# Patient Record
Sex: Female | Born: 1960 | Race: White | Hispanic: No | Smoking: Former smoker
Health system: Southern US, Community
[De-identification: ages and names within clinical notes are randomized; demographics above are authoritative.]

## PROBLEM LIST (undated history)

## (undated) DIAGNOSIS — F329 Major depressive disorder, single episode, unspecified: Secondary | ICD-10-CM

## (undated) DIAGNOSIS — G8929 Other chronic pain: Secondary | ICD-10-CM

## (undated) DIAGNOSIS — M549 Dorsalgia, unspecified: Secondary | ICD-10-CM

## (undated) DIAGNOSIS — F32A Depression, unspecified: Secondary | ICD-10-CM

## (undated) DIAGNOSIS — K219 Gastro-esophageal reflux disease without esophagitis: Secondary | ICD-10-CM

## (undated) HISTORY — PX: CERVICAL FUSION: SHX112

## (undated) HISTORY — DX: Gastro-esophageal reflux disease without esophagitis: K21.9

## (undated) HISTORY — DX: Other chronic pain: G89.29

---

## 2003-11-14 ENCOUNTER — Inpatient Hospital Stay (HOSPITAL_COMMUNITY): Admission: RE | Admit: 2003-11-14 | Discharge: 2003-11-19 | Payer: Self-pay | Admitting: Neurological Surgery

## 2004-04-13 ENCOUNTER — Ambulatory Visit (HOSPITAL_COMMUNITY): Admission: RE | Admit: 2004-04-13 | Discharge: 2004-04-13 | Payer: Self-pay | Admitting: Neurological Surgery

## 2006-11-28 ENCOUNTER — Ambulatory Visit (HOSPITAL_COMMUNITY): Payer: Self-pay | Admitting: Psychiatry

## 2006-12-02 ENCOUNTER — Ambulatory Visit (HOSPITAL_COMMUNITY): Payer: Self-pay | Admitting: Psychiatry

## 2007-01-08 ENCOUNTER — Ambulatory Visit (HOSPITAL_COMMUNITY): Payer: Self-pay | Admitting: Psychiatry

## 2007-03-13 ENCOUNTER — Ambulatory Visit (HOSPITAL_COMMUNITY): Payer: Self-pay | Admitting: Psychiatry

## 2007-03-18 ENCOUNTER — Ambulatory Visit (HOSPITAL_COMMUNITY): Payer: Self-pay | Admitting: Psychology

## 2007-05-11 ENCOUNTER — Ambulatory Visit (HOSPITAL_COMMUNITY): Payer: Self-pay | Admitting: Psychiatry

## 2007-10-12 ENCOUNTER — Ambulatory Visit (HOSPITAL_COMMUNITY): Payer: Self-pay | Admitting: Psychiatry

## 2007-11-09 ENCOUNTER — Ambulatory Visit (HOSPITAL_COMMUNITY): Payer: Self-pay | Admitting: Psychiatry

## 2008-01-12 ENCOUNTER — Ambulatory Visit (HOSPITAL_COMMUNITY): Payer: Self-pay | Admitting: Psychiatry

## 2008-03-08 ENCOUNTER — Ambulatory Visit (HOSPITAL_COMMUNITY): Payer: Self-pay | Admitting: Psychology

## 2008-03-17 ENCOUNTER — Ambulatory Visit (HOSPITAL_COMMUNITY): Payer: Self-pay | Admitting: Psychology

## 2010-06-08 NOTE — Op Note (Signed)
Mackenzie Charles, Mackenzie Charles         ACCOUNT NO.:  [id number]   MEDICAL RECORD NO.:  [id number]          PATIENT TYPE:  INP   LOCATION:  3011                         FACILITY:  [facility]   PHYSICIAN:  Stefani Dama, M.D.  DATE OF BIRTH:  10-Mar-1960   DATE OF PROCEDURE:  11/14/2003  DATE OF DISCHARGE:                                 OPERATIVE REPORT   PREOPERATIVE DIAGNOSIS:  L4-L5 spondylolisthesis with herniated nucleus  pulposus and right lumbar radiculopathy.   POSTOPERATIVE DIAGNOSIS:  L4-L5 spondylolisthesis with herniated nucleus  pulposus and right lumbar radiculopathy.   OPERATION:  Bilateral laminotomies, foraminotomies, discectomy L4-L5, poster  lumbar interbody arthrodesis with pedicle screw fixation and Alphatec  __________ cages, arthrodesis with local autograft and allograft.   SURGEON:  Stefani Dama, M.D.   FIRST ASSISTANT:  Mackenzie Charles. Mackenzie Charles, M.D.   ANESTHESIA:  General endotracheal anesthesia.   INDICATIONS FOR PROCEDURE:  Mackenzie Charles is a 50 year old individual who  has had significant back and right lower extremity pain.  She has evidence  of a herniated nucleus pulposus at the L4-L5 level in addition to severe  degenerative spondylolisthesis.  She had been advised regarding surgical  decompression of same.   DESCRIPTION OF PROCEDURE:  The patient was brought to the operating room  supine on a stretcher after smooth induction of general endotracheal  anesthesia.  She was turned prone, the back was prepped with DuraPrep and  draped in a sterile fashion.  A midline incision was created and carried  down to the lumbodorsal fascia which was opened on either side of the  midline.  Fluoroscopy was used to identify the laminar arch of L4.  The  spaces were then cleared out in subperiosteal fashion dissecting the laminar  arches out to the facet joints at L4-L5.  Self-retaining McCullough  retractor was placed in the wound.  The dissection was then carried to  expose the interlaminar space at L4-L5 and laminotomies were then created  removing the inferior margin of lamina of L4 out to and including the facet  joint at L4-L5.  The common dural tube was then decompressed and the takeoff  of the L5 nerve root was identified.  There was noted to be a large  subligamentous disc herniation on the left side at the L4-L5 space.  This  was cleared using 15 blade to open the posterior longitudinal ligament then  evacuating a substantial quantity of degenerative disc material.  The disc  space was then entered and a complete discectomy was performed both medially  and laterally, being careful to expose the L4 nerve root superiorly and  protect the nerve root while discectomy was completed.  On the right side  then similar procedure was carried out and a complete discectomy was  similarly carried out.  The end plates were then rongeured carefully and a  raspatory was used to smooth the end plates and allow foot placement of  interbody graft.  A 10 mm size interbody graft was chosen and this was  placed without difficulty and countersunk a few millimeters.  [name]  own bone was mixed with 10 mL alloquat  of a tricalcium phosphate bone  matrix.  This was mixed with an aspirate from the pedicle obtained from L4  and L5.  This was tamped into position and countersunk and once the  interbody grafting procedure was completed, pedicle entry sites were chosen  at L4 and L5 and these were checked fluoroscopically.  Then 6.5 x 40 mm  screws were tapped and placed into the pedicles at L4 and L5.  These were  connected with short precontoured 35 mm rods.  The system was tightened down  and a slight bit of compression.  The final localizing radiograph of both  the AP and lateral projection identified a very natural curve to the lumbar  spine.  The wound was copiously irrigated with antibiotic irrigating  solution and finally the inner laminar space was filled with a  pledge of  Healos bone matrix substitute to augment posterior fusion.  Once this was  completed then retractor was removed. Lumbodorsal fascia was closed with #1  Vicryl interrupted fashion, 2-0 Vicryl was used subcutaneously, 3-0 Vicryl  subcuticularly.  Dermabond was placed on the skin.  Blood loss for the  procedure was estimated about 400 mL.       HJE/MEDQ  D:  11/14/2003  T:  11/14/2003  Job:  [id number]
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
## 2014-10-06 ENCOUNTER — Inpatient Hospital Stay (HOSPITAL_BASED_OUTPATIENT_CLINIC_OR_DEPARTMENT_OTHER)
Admission: EM | Admit: 2014-10-06 | Discharge: 2014-10-12 | DRG: 029 | Disposition: A | Discharge status: Skilled Nursing Facility | Payer: Medicare Other | Attending: Neurosurgery | Admitting: Neurosurgery

## 2014-10-06 ENCOUNTER — Emergency Department (HOSPITAL_COMMUNITY): Payer: Medicare Other

## 2014-10-06 ENCOUNTER — Encounter (HOSPITAL_BASED_OUTPATIENT_CLINIC_OR_DEPARTMENT_OTHER): Payer: Self-pay | Admitting: Emergency Medicine

## 2014-10-06 DIAGNOSIS — M4646 Discitis, unspecified, lumbar region: Secondary | ICD-10-CM | POA: Diagnosis present

## 2014-10-06 DIAGNOSIS — M5440 Lumbago with sciatica, unspecified side: Secondary | ICD-10-CM | POA: Diagnosis not present

## 2014-10-06 DIAGNOSIS — K5900 Constipation, unspecified: Secondary | ICD-10-CM | POA: Diagnosis present

## 2014-10-06 DIAGNOSIS — B954 Other streptococcus as the cause of diseases classified elsewhere: Secondary | ICD-10-CM | POA: Diagnosis present

## 2014-10-06 DIAGNOSIS — B9620 Unspecified Escherichia coli [E. coli] as the cause of diseases classified elsewhere: Secondary | ICD-10-CM | POA: Diagnosis present

## 2014-10-06 DIAGNOSIS — K219 Gastro-esophageal reflux disease without esophagitis: Secondary | ICD-10-CM | POA: Diagnosis present

## 2014-10-06 DIAGNOSIS — G8220 Paraplegia, unspecified: Secondary | ICD-10-CM | POA: Diagnosis present

## 2014-10-06 DIAGNOSIS — R531 Weakness: Secondary | ICD-10-CM

## 2014-10-06 DIAGNOSIS — Z419 Encounter for procedure for purposes other than remedying health state, unspecified: Secondary | ICD-10-CM

## 2014-10-06 DIAGNOSIS — E669 Obesity, unspecified: Secondary | ICD-10-CM | POA: Diagnosis present

## 2014-10-06 DIAGNOSIS — F17210 Nicotine dependence, cigarettes, uncomplicated: Secondary | ICD-10-CM | POA: Diagnosis present

## 2014-10-06 DIAGNOSIS — M4626 Osteomyelitis of vertebra, lumbar region: Secondary | ICD-10-CM | POA: Diagnosis present

## 2014-10-06 DIAGNOSIS — M4806 Spinal stenosis, lumbar region: Secondary | ICD-10-CM | POA: Diagnosis present

## 2014-10-06 DIAGNOSIS — M609 Myositis, unspecified: Secondary | ICD-10-CM | POA: Diagnosis present

## 2014-10-06 DIAGNOSIS — G061 Intraspinal abscess and granuloma: Secondary | ICD-10-CM | POA: Diagnosis not present

## 2014-10-06 DIAGNOSIS — Z6841 Body Mass Index (BMI) 40.0 and over, adult: Secondary | ICD-10-CM

## 2014-10-06 DIAGNOSIS — F329 Major depressive disorder, single episode, unspecified: Secondary | ICD-10-CM | POA: Diagnosis present

## 2014-10-06 DIAGNOSIS — Z981 Arthrodesis status: Secondary | ICD-10-CM

## 2014-10-06 DIAGNOSIS — Z79891 Long term (current) use of opiate analgesic: Secondary | ICD-10-CM

## 2014-10-06 DIAGNOSIS — M549 Dorsalgia, unspecified: Secondary | ICD-10-CM

## 2014-10-06 DIAGNOSIS — N390 Urinary tract infection, site not specified: Secondary | ICD-10-CM | POA: Diagnosis present

## 2014-10-06 HISTORY — DX: Dorsalgia, unspecified: M54.9

## 2014-10-06 HISTORY — DX: Major depressive disorder, single episode, unspecified: F32.9

## 2014-10-06 HISTORY — DX: Depression, unspecified: F32.A

## 2014-10-06 HISTORY — DX: Other chronic pain: G89.29

## 2014-10-06 LAB — BASIC METABOLIC PANEL
Anion gap: 10 (ref 5–15)
BUN: 9 mg/dL (ref 6–20)
CO2: 25 mmol/L (ref 22–32)
Calcium: 8.7 mg/dL — ABNORMAL LOW (ref 8.9–10.3)
Chloride: 101 mmol/L (ref 101–111)
Creatinine, Ser: 0.52 mg/dL (ref 0.44–1.00)
GFR calc Af Amer: >60 mL/min (ref >60)
GFR calc non Af Amer: >60 mL/min (ref >60)
Glucose, Bld: 109 mg/dL — ABNORMAL HIGH (ref 65–99)
Potassium: 2.8 mmol/L — ABNORMAL LOW (ref 3.5–5.1)
Sodium: 136 mmol/L (ref 135–145)

## 2014-10-06 LAB — CBC WITH DIFFERENTIAL/PLATELET
Basophils Absolute: 0 10*3/uL (ref 0.0–0.1)
Basophils Relative: 0 %
Eosinophils Absolute: 0 10*3/uL (ref 0.0–0.7)
Eosinophils Relative: 0 %
HCT: 34.2 % — ABNORMAL LOW (ref 36.0–46.0)
Hemoglobin: 11.2 g/dL — ABNORMAL LOW (ref 12.0–15.0)
Lymphocytes Relative: 12 %
Lymphs Abs: 1.4 10*3/uL (ref 0.7–4.0)
MCH: 24.7 pg — ABNORMAL LOW (ref 26.0–34.0)
MCHC: 32.7 g/dL (ref 30.0–36.0)
MCV: 75.3 fL — ABNORMAL LOW (ref 78.0–100.0)
Monocytes Absolute: 0.7 10*3/uL (ref 0.1–1.0)
Monocytes Relative: 6 %
Neutro Abs: 9.3 10*3/uL — ABNORMAL HIGH (ref 1.7–7.7)
Neutrophils Relative %: 82 %
Platelets: 178 10*3/uL (ref 150–400)
RBC: 4.54 MIL/uL (ref 3.87–5.11)
RDW: 15.3 % (ref 11.5–15.5)
WBC: 11.4 10*3/uL — ABNORMAL HIGH (ref 4.0–10.5)

## 2014-10-06 LAB — URINE MICROSCOPIC-ADD ON

## 2014-10-06 LAB — URINALYSIS, ROUTINE W REFLEX MICROSCOPIC
Bilirubin Urine: NEGATIVE
Glucose, UA: NEGATIVE mg/dL
Ketones, ur: NEGATIVE mg/dL
Nitrite: NEGATIVE
Protein, ur: NEGATIVE mg/dL
Specific Gravity, Urine: 1.015 (ref 1.005–1.030)
Urobilinogen, UA: 1 mg/dL (ref 0.0–1.0)
pH: 6 (ref 5.0–8.0)

## 2014-10-06 MED ORDER — HYDROMORPHONE HCL 1 MG/ML IJ SOLN
1.0000 mg | Freq: Once | INTRAMUSCULAR | Status: AC
  Administered 2014-10-06: 1 mg via INTRAVENOUS
  Filled 2014-10-06: qty 1

## 2014-10-06 MED ORDER — LORAZEPAM 2 MG/ML IJ SOLN
1.0000 mg | Freq: Once | INTRAMUSCULAR | Status: AC
  Administered 2014-10-06: 1 mg via INTRAVENOUS
  Filled 2014-10-06: qty 1

## 2014-10-06 NOTE — ED Provider Notes (Signed)
CSN: [id number]     Arrival date & time 10/06/14  2159 History  This chart was scribed for Zadie Rhine, MD by Gwenyth Ober, ED Scribe. This patient was seen in room MH04/MH04 and the patient's care was started at 10:12 PM.    Chief Complaint  Patient presents with  . Back Pain   The history is provided by the patient. No language interpreter was used.   HPI Comments: Mackenzie Charles is a 54 y.o. female brought in by EMS, with a history of chronic back pain and multiple spinal fusions, who presents to the Emergency Department complaining of chronic, moderate lower back pain and progressively worsening weakness of her bilateral lower extremities within the last 24 hours. She states numbness of her lower legs as an associated symptom. Pt reports that she was seen by [facility] 2 days ago for worsening leg pain, but denies weakness at that time. She has been unable to use her legs today. Pt states that she was able to walk 2-3 days ago. She denies bladder or bowel incontinence, CP, abdominal pain and vomiting as associated symptoms.  Past Medical History  Diagnosis Date  . Depression   . Chronic back pain    Past Surgical History  Procedure Laterality Date  . Cervical fusion     History reviewed. No pertinent family history. Social History  Substance Use Topics  . Smoking status: Not on file  . Smokeless tobacco: Not on file  . Alcohol Use: Not on file   OB History    No data available     Review of Systems  Cardiovascular: Negative for chest pain.  Gastrointestinal: Negative for vomiting and abdominal pain.  Musculoskeletal: Positive for back pain.  Neurological: Positive for weakness and numbness.  All other systems reviewed and are negative.  Allergies  Review of patient's allergies indicates no known allergies.  Home Medications   Prior to Admission medications   Medication Sig Start Date End Date Taking? Authorizing Provider  buPROPion (WELLBUTRIN) 100 MG  tablet Take 100 mg by mouth 2 (two) times daily.   Yes Historical Provider, MD  cyclobenzaprine (FLEXERIL) 10 MG tablet Take 10 mg by mouth 3 (three) times daily as needed for muscle spasms.   Yes Historical Provider, MD  gabapentin (NEURONTIN) 100 MG capsule Take 100 mg by mouth 3 (three) times daily.   Yes Historical Provider, MD  HYDROmorphone (DILAUDID) 2 MG tablet Take by mouth every 4 (four) hours as needed for severe pain.   Yes Historical Provider, MD  methadone (DOLOPHINE) 10 MG tablet Take 10 mg by mouth every 8 (eight) hours.   Yes Historical Provider, MD  omeprazole (PRILOSEC) 40 MG capsule Take 40 mg by mouth daily.   Yes Historical Provider, MD  sertraline (ZOLOFT) 100 MG tablet Take 100 mg by mouth daily.   Yes Historical Provider, MD   BP 145/99 mmHg  Pulse 88  Temp(Src) 98.7 F (37.1 C)  Resp 18  Wt 240 lb (108.863 kg)  SpO2 100% Physical Exam  Nursing note and vitals reviewed. CONSTITUTIONAL: uncomfortable appearing HEAD: Normocephalic/atraumatic EYES: EOM ENMT: Mucous membranes moist NECK: supple no meningeal signs SPINE/BACK: diffuse lumbar tenderness, No bruising/crepitance/stepoffs noted to spine CV: S1/S2 noted, no murmurs/rubs/gallops noted LUNGS: Lungs are clear to auscultation bilaterally, no apparent distress ABDOMEN: soft, nontender, no rebound or guarding, bowel sounds noted throughout abdomen, obese GU:no cva tenderness NEURO: Pt is awake/alert/appropriate; decreased rectal tone (chaperone present); pt is unable to move either leg and appears  paralyzed below the waist.  She has decreased sensation in both legs EXTREMITIES: pulses normal/equal SKIN: warm, color normal PSYCH: no abnormalities of mood noted, alert and oriented to situation  ED Course  Procedures   DIAGNOSTIC STUDIES: Oxygen Saturation is 100% on RA, normal by my interpretation.    COORDINATION OF CARE: Rapid assessment on arrival She required multiple staff members and a lift just to  get into bed Reporting worsening back pain with LE weakness and decreased rectal tone I am concerned for acute spinal cord pathology Will need emergent transfer for STAT MRI She reports she was just at [facility] but unable to see records in [facility] 10:31 PM Consult with radiology regarding MRI compatibility. Dr bloomer report pt has had MR imaging before D/w dr knot at [facility] ED - will transfer to [facility] ED for STAT MR thoracic/lumbar with/without contrast I spoke to MR tech and aware of patient and need for imaging and order placed I will also consult neurosurgery prior to MRI     MDM   Final diagnoses:  Midline low back pain with sciatica, sciatica laterality unspecified  Weakness  Back pain  Back pain    Nursing notes including past medical history and social history reviewed and considered in documentation   I personally performed the services described in this documentation, which was scribed in my presence. The recorded information has been reviewed and is accurate.      Zadie Rhine, MD 10/06/14 [phone]

## 2014-10-06 NOTE — ED Notes (Signed)
[facility] transported the patient here from [facility] where she was being seen for Chronic back pain, bilateral leg pain and numbness.  She is here to have and MRI done.  The patient was accepted by Dr. Clydene Pugh.

## 2014-10-06 NOTE — ED Provider Notes (Signed)
I spoke to dr Delma Officer with [facility] I made him aware of significant neuro findings and need for MRI He is aware of patient and will be expecting call with MRI results   Zadie Rhine, MD 10/06/14 2252

## 2014-10-06 NOTE — ED Notes (Signed)
Patient screaming in pain and writhing around in the bed.

## 2014-10-06 NOTE — ED Notes (Signed)
C/o back pain onset 2 days ago worse today,  States increased w pain w leg movement, but states she cannot move her legs

## 2014-10-06 NOTE — ED Notes (Signed)
Patient comes in via EMS due to long term pain to her back, with increased numbness to her lower back. The patient states that she can not feel her lower legs or use them.

## 2014-10-07 ENCOUNTER — Emergency Department (HOSPITAL_COMMUNITY): Payer: Medicare Other | Admitting: Certified Registered Nurse Anesthetist

## 2014-10-07 ENCOUNTER — Encounter (HOSPITAL_COMMUNITY)
Admission: EM | Disposition: A | Discharge status: Skilled Nursing Facility | Payer: Self-pay | Source: Home / Self Care | Attending: Neurosurgery

## 2014-10-07 ENCOUNTER — Encounter (HOSPITAL_COMMUNITY): Payer: Self-pay | Admitting: Certified Registered Nurse Anesthetist

## 2014-10-07 DIAGNOSIS — M609 Myositis, unspecified: Secondary | ICD-10-CM | POA: Diagnosis present

## 2014-10-07 DIAGNOSIS — M5440 Lumbago with sciatica, unspecified side: Secondary | ICD-10-CM | POA: Diagnosis present

## 2014-10-07 DIAGNOSIS — G061 Intraspinal abscess and granuloma: Secondary | ICD-10-CM | POA: Diagnosis present

## 2014-10-07 DIAGNOSIS — Z981 Arthrodesis status: Secondary | ICD-10-CM | POA: Diagnosis not present

## 2014-10-07 DIAGNOSIS — M5106 Intervertebral disc disorders with myelopathy, lumbar region: Secondary | ICD-10-CM | POA: Diagnosis not present

## 2014-10-07 DIAGNOSIS — N390 Urinary tract infection, site not specified: Secondary | ICD-10-CM | POA: Diagnosis present

## 2014-10-07 DIAGNOSIS — M4646 Discitis, unspecified, lumbar region: Secondary | ICD-10-CM | POA: Diagnosis present

## 2014-10-07 DIAGNOSIS — F17210 Nicotine dependence, cigarettes, uncomplicated: Secondary | ICD-10-CM | POA: Diagnosis present

## 2014-10-07 DIAGNOSIS — Z6841 Body Mass Index (BMI) 40.0 and over, adult: Secondary | ICD-10-CM | POA: Diagnosis not present

## 2014-10-07 DIAGNOSIS — F329 Major depressive disorder, single episode, unspecified: Secondary | ICD-10-CM | POA: Diagnosis present

## 2014-10-07 DIAGNOSIS — M4806 Spinal stenosis, lumbar region: Secondary | ICD-10-CM | POA: Diagnosis present

## 2014-10-07 DIAGNOSIS — E669 Obesity, unspecified: Secondary | ICD-10-CM | POA: Diagnosis present

## 2014-10-07 DIAGNOSIS — G8220 Paraplegia, unspecified: Secondary | ICD-10-CM | POA: Diagnosis present

## 2014-10-07 DIAGNOSIS — B954 Other streptococcus as the cause of diseases classified elsewhere: Secondary | ICD-10-CM | POA: Diagnosis present

## 2014-10-07 DIAGNOSIS — M4626 Osteomyelitis of vertebra, lumbar region: Secondary | ICD-10-CM | POA: Diagnosis present

## 2014-10-07 DIAGNOSIS — Z79891 Long term (current) use of opiate analgesic: Secondary | ICD-10-CM | POA: Diagnosis not present

## 2014-10-07 DIAGNOSIS — K5900 Constipation, unspecified: Secondary | ICD-10-CM | POA: Diagnosis present

## 2014-10-07 DIAGNOSIS — K219 Gastro-esophageal reflux disease without esophagitis: Secondary | ICD-10-CM | POA: Diagnosis present

## 2014-10-07 DIAGNOSIS — B9620 Unspecified Escherichia coli [E. coli] as the cause of diseases classified elsewhere: Secondary | ICD-10-CM | POA: Diagnosis present

## 2014-10-07 HISTORY — PX: LUMBAR LAMINECTOMY/DECOMPRESSION MICRODISCECTOMY: SHX5026

## 2014-10-07 LAB — GRAM STAIN

## 2014-10-07 LAB — SEDIMENTATION RATE: Sed Rate: 87 mm/hr — ABNORMAL HIGH (ref 0–22)

## 2014-10-07 LAB — MRSA PCR SCREENING: MRSA BY PCR: POSITIVE — AB

## 2014-10-07 SURGERY — LUMBAR LAMINECTOMY/DECOMPRESSION MICRODISCECTOMY 1 LEVEL
Anesthesia: General | Site: Back

## 2014-10-07 MED ORDER — FENTANYL CITRATE (PF) 250 MCG/5ML IJ SOLN
INTRAMUSCULAR | Status: AC
  Filled 2014-10-07: qty 5

## 2014-10-07 MED ORDER — LACTATED RINGERS IV SOLN
INTRAVENOUS | Status: DC
  Administered 2014-10-07: 16:00:00 via INTRAVENOUS

## 2014-10-07 MED ORDER — DIAZEPAM 5 MG PO TABS
5.0000 mg | ORAL_TABLET | Freq: Four times a day (QID) | ORAL | Status: DC | PRN
  Administered 2014-10-08 – 2014-10-12 (×10): 5 mg via ORAL
  Filled 2014-10-07 (×11): qty 1

## 2014-10-07 MED ORDER — ROCURONIUM BROMIDE 100 MG/10ML IV SOLN
INTRAVENOUS | Status: DC | PRN
  Administered 2014-10-07: 10 mg via INTRAVENOUS
  Administered 2014-10-07: 30 mg via INTRAVENOUS

## 2014-10-07 MED ORDER — GABAPENTIN 100 MG PO CAPS
100.0000 mg | ORAL_CAPSULE | Freq: Three times a day (TID) | ORAL | Status: DC
  Administered 2014-10-07 – 2014-10-12 (×16): 100 mg via ORAL
  Filled 2014-10-07 (×19): qty 1

## 2014-10-07 MED ORDER — PHENOL 1.4 % MT LIQD: 1.0000 | OROMUCOSAL | Status: DC | PRN

## 2014-10-07 MED ORDER — GADOBENATE DIMEGLUMINE 529 MG/ML IV SOLN
20.0000 mL | Freq: Once | INTRAVENOUS | Status: AC | PRN
  Administered 2014-10-07: 20 mL via INTRAVENOUS

## 2014-10-07 MED ORDER — MORPHINE SULFATE (PF) 2 MG/ML IV SOLN
1.0000 mg | INTRAVENOUS | Status: DC | PRN
  Administered 2014-10-07 (×3): 4 mg via INTRAVENOUS
  Filled 2014-10-07 (×3): qty 2

## 2014-10-07 MED ORDER — OXYCODONE-ACETAMINOPHEN 5-325 MG PO TABS
1.0000 | ORAL_TABLET | ORAL | Status: DC | PRN
  Administered 2014-10-08 – 2014-10-12 (×17): 2 via ORAL
  Filled 2014-10-07 (×18): qty 2

## 2014-10-07 MED ORDER — THROMBIN 20000 UNITS EX SOLR
CUTANEOUS | Status: DC | PRN
  Administered 2014-10-07: 20 mL via TOPICAL

## 2014-10-07 MED ORDER — NEOSTIGMINE METHYLSULFATE 10 MG/10ML IV SOLN
INTRAVENOUS | Status: DC | PRN
  Administered 2014-10-07: 4 mg via INTRAVENOUS

## 2014-10-07 MED ORDER — PROMETHAZINE HCL 25 MG/ML IJ SOLN: 6.2500 mg | INTRAMUSCULAR | Status: DC | PRN

## 2014-10-07 MED ORDER — ALUM & MAG HYDROXIDE-SIMETH 200-200-20 MG/5ML PO SUSP
30.0000 mL | Freq: Four times a day (QID) | ORAL | Status: DC | PRN
  Administered 2014-10-10: 30 mL via ORAL
  Filled 2014-10-07: qty 30

## 2014-10-07 MED ORDER — DEXTROSE 5 % IV SOLN
2.0000 g | INTRAVENOUS | Status: DC
  Administered 2014-10-07 – 2014-10-12 (×6): 2 g via INTRAVENOUS
  Filled 2014-10-07 (×6): qty 2

## 2014-10-07 MED ORDER — CEFAZOLIN SODIUM-DEXTROSE 2-3 GM-% IV SOLR
INTRAVENOUS | Status: DC | PRN
  Administered 2014-10-07: 2 g via INTRAVENOUS

## 2014-10-07 MED ORDER — SODIUM CHLORIDE 0.9 % IR SOLN
Status: DC | PRN
  Administered 2014-10-07: 500 mL

## 2014-10-07 MED ORDER — VANCOMYCIN HCL 1000 MG IV SOLR
INTRAVENOUS | Status: DC | PRN
  Administered 2014-10-07: 1000 mg

## 2014-10-07 MED ORDER — PANTOPRAZOLE SODIUM 40 MG PO TBEC
80.0000 mg | DELAYED_RELEASE_TABLET | Freq: Every day | ORAL | Status: DC
  Administered 2014-10-07 – 2014-10-12 (×6): 80 mg via ORAL
  Filled 2014-10-07 (×6): qty 2

## 2014-10-07 MED ORDER — ONDANSETRON HCL 4 MG/2ML IJ SOLN
INTRAMUSCULAR | Status: AC
  Filled 2014-10-07: qty 2

## 2014-10-07 MED ORDER — PROPOFOL 10 MG/ML IV BOLUS
INTRAVENOUS | Status: DC | PRN
  Administered 2014-10-07: 180 mg via INTRAVENOUS

## 2014-10-07 MED ORDER — FENTANYL 75 MCG/HR TD PT72
75.0000 ug | MEDICATED_PATCH | TRANSDERMAL | Status: DC
  Administered 2014-10-07 – 2014-10-10 (×2): 75 ug via TRANSDERMAL
  Filled 2014-10-07 (×4): qty 1

## 2014-10-07 MED ORDER — MIDAZOLAM HCL 2 MG/2ML IJ SOLN
INTRAMUSCULAR | Status: AC
  Filled 2014-10-07: qty 4

## 2014-10-07 MED ORDER — METHADONE HCL 10 MG PO TABS
10.0000 mg | ORAL_TABLET | Freq: Three times a day (TID) | ORAL | Status: DC
  Administered 2014-10-07 – 2014-10-12 (×17): 10 mg via ORAL
  Filled 2014-10-07 (×17): qty 1

## 2014-10-07 MED ORDER — VANCOMYCIN HCL 10 G IV SOLR
1500.0000 mg | Freq: Once | INTRAVENOUS | Status: AC
  Administered 2014-10-07: 1500 mg via INTRAVENOUS
  Filled 2014-10-07: qty 1500

## 2014-10-07 MED ORDER — ACETAMINOPHEN 10 MG/ML IV SOLN
INTRAVENOUS | Status: AC
  Administered 2014-10-07: 1000 mg via INTRAVENOUS
  Filled 2014-10-07: qty 100

## 2014-10-07 MED ORDER — DEXAMETHASONE SODIUM PHOSPHATE 4 MG/ML IJ SOLN
INTRAMUSCULAR | Status: AC
  Filled 2014-10-07: qty 2

## 2014-10-07 MED ORDER — PROMETHAZINE HCL 25 MG PO TABS: 25.0000 mg | ORAL_TABLET | Freq: Four times a day (QID) | ORAL | Status: DC | PRN

## 2014-10-07 MED ORDER — HYDROMORPHONE HCL 1 MG/ML IJ SOLN: 0.2500 mg | INTRAMUSCULAR | Status: DC | PRN

## 2014-10-07 MED ORDER — MEPERIDINE HCL 25 MG/ML IJ SOLN: 6.2500 mg | INTRAMUSCULAR | Status: DC | PRN

## 2014-10-07 MED ORDER — ARTIFICIAL TEARS OP OINT
TOPICAL_OINTMENT | OPHTHALMIC | Status: AC
  Filled 2014-10-07: qty 3.5

## 2014-10-07 MED ORDER — LIDOCAINE HCL (CARDIAC) 20 MG/ML IV SOLN
INTRAVENOUS | Status: AC
  Filled 2014-10-07: qty 5

## 2014-10-07 MED ORDER — MENTHOL 3 MG MT LOZG: 1.0000 | LOZENGE | OROMUCOSAL | Status: DC | PRN

## 2014-10-07 MED ORDER — PROPOFOL 10 MG/ML IV BOLUS
INTRAVENOUS | Status: AC
  Filled 2014-10-07: qty 20

## 2014-10-07 MED ORDER — CYCLOBENZAPRINE HCL 10 MG PO TABS
10.0000 mg | ORAL_TABLET | Freq: Three times a day (TID) | ORAL | Status: DC | PRN
  Administered 2014-10-07 – 2014-10-11 (×7): 10 mg via ORAL
  Filled 2014-10-07 (×7): qty 1

## 2014-10-07 MED ORDER — LACTATED RINGERS IV SOLN
INTRAVENOUS | Status: DC | PRN
  Administered 2014-10-07 (×2): via INTRAVENOUS

## 2014-10-07 MED ORDER — PHENYLEPHRINE 40 MCG/ML (10ML) SYRINGE FOR IV PUSH (FOR BLOOD PRESSURE SUPPORT)
PREFILLED_SYRINGE | INTRAVENOUS | Status: AC
  Filled 2014-10-07: qty 10

## 2014-10-07 MED ORDER — BUPIVACAINE-EPINEPHRINE 0.5% -1:200000 IJ SOLN
INTRAMUSCULAR | Status: DC | PRN
  Administered 2014-10-07: 10 mL

## 2014-10-07 MED ORDER — EPHEDRINE SULFATE 50 MG/ML IJ SOLN
INTRAMUSCULAR | Status: AC
Start: 2014-10-07 — End: 2014-10-07
  Filled 2014-10-07: qty 1

## 2014-10-07 MED ORDER — LACTATED RINGERS IV SOLN: INTRAVENOUS | Status: DC

## 2014-10-07 MED ORDER — HYDROCODONE-ACETAMINOPHEN 5-325 MG PO TABS
1.0000 | ORAL_TABLET | ORAL | Status: DC | PRN
  Administered 2014-10-08: 2 via ORAL
  Filled 2014-10-07 (×2): qty 2

## 2014-10-07 MED ORDER — VANCOMYCIN HCL 1000 MG IV SOLR
INTRAVENOUS | Status: AC
  Filled 2014-10-07: qty 1000

## 2014-10-07 MED ORDER — ALBUTEROL SULFATE HFA 108 (90 BASE) MCG/ACT IN AERS
INHALATION_SPRAY | RESPIRATORY_TRACT | Status: DC | PRN
  Administered 2014-10-07: 3 via RESPIRATORY_TRACT

## 2014-10-07 MED ORDER — BISACODYL 10 MG RE SUPP: 10.0000 mg | Freq: Every day | RECTAL | Status: DC | PRN

## 2014-10-07 MED ORDER — ONDANSETRON HCL 4 MG/2ML IJ SOLN
4.0000 mg | INTRAMUSCULAR | Status: DC | PRN
  Administered 2014-10-07 – 2014-10-08 (×3): 4 mg via INTRAVENOUS
  Filled 2014-10-07 (×3): qty 2

## 2014-10-07 MED ORDER — SODIUM CHLORIDE 0.9 % IJ SOLN
INTRAMUSCULAR | Status: AC
  Filled 2014-10-07: qty 10

## 2014-10-07 MED ORDER — DOCUSATE SODIUM 100 MG PO CAPS
100.0000 mg | ORAL_CAPSULE | Freq: Two times a day (BID) | ORAL | Status: DC
  Administered 2014-10-07 – 2014-10-12 (×11): 100 mg via ORAL
  Filled 2014-10-07 (×11): qty 1

## 2014-10-07 MED ORDER — LIDOCAINE HCL (CARDIAC) 20 MG/ML IV SOLN
INTRAVENOUS | Status: DC | PRN
  Administered 2014-10-07: 70 mg via INTRAVENOUS

## 2014-10-07 MED ORDER — GLYCOPYRROLATE 0.2 MG/ML IJ SOLN
INTRAMUSCULAR | Status: DC | PRN
  Administered 2014-10-07: 0.6 mg via INTRAVENOUS

## 2014-10-07 MED ORDER — BUPROPION HCL 100 MG PO TABS
100.0000 mg | ORAL_TABLET | Freq: Two times a day (BID) | ORAL | Status: DC
  Administered 2014-10-07 – 2014-10-12 (×11): 100 mg via ORAL
  Filled 2014-10-07 (×13): qty 1

## 2014-10-07 MED ORDER — ROCURONIUM BROMIDE 50 MG/5ML IV SOLN
INTRAVENOUS | Status: AC
  Filled 2014-10-07: qty 1

## 2014-10-07 MED ORDER — GLYCOPYRROLATE 0.2 MG/ML IJ SOLN
INTRAMUSCULAR | Status: AC
  Filled 2014-10-07: qty 3

## 2014-10-07 MED ORDER — SERTRALINE HCL 100 MG PO TABS
100.0000 mg | ORAL_TABLET | Freq: Every day | ORAL | Status: DC
  Administered 2014-10-07 – 2014-10-12 (×6): 100 mg via ORAL
  Filled 2014-10-07: qty 2
  Filled 2014-10-07: qty 1
  Filled 2014-10-07 (×2): qty 2
  Filled 2014-10-07: qty 1
  Filled 2014-10-07: qty 2

## 2014-10-07 MED ORDER — LORAZEPAM 2 MG/ML IJ SOLN
2.0000 mg | Freq: Once | INTRAMUSCULAR | Status: AC
  Administered 2014-10-07: 2 mg via INTRAVENOUS
  Filled 2014-10-07: qty 1

## 2014-10-07 MED ORDER — HYDROMORPHONE HCL 2 MG PO TABS
4.0000 mg | ORAL_TABLET | ORAL | Status: DC | PRN
  Administered 2014-10-07 – 2014-10-10 (×6): 4 mg via ORAL
  Filled 2014-10-07 (×6): qty 2

## 2014-10-07 MED ORDER — POTASSIUM CHLORIDE 10 MEQ/100ML IV SOLN
10.0000 meq | Freq: Once | INTRAVENOUS | Status: AC
  Administered 2014-10-07: 10 meq via INTRAVENOUS
  Filled 2014-10-07: qty 100

## 2014-10-07 MED ORDER — SUCCINYLCHOLINE CHLORIDE 20 MG/ML IJ SOLN
INTRAMUSCULAR | Status: DC | PRN
  Administered 2014-10-07: 130 mg via INTRAVENOUS

## 2014-10-07 MED ORDER — DEXAMETHASONE SODIUM PHOSPHATE 4 MG/ML IJ SOLN
INTRAMUSCULAR | Status: DC | PRN
  Administered 2014-10-07: 8 mg via INTRAVENOUS

## 2014-10-07 MED ORDER — FENTANYL CITRATE (PF) 100 MCG/2ML IJ SOLN
INTRAMUSCULAR | Status: DC | PRN
  Administered 2014-10-07 (×2): 50 ug via INTRAVENOUS
  Administered 2014-10-07: 100 ug via INTRAVENOUS
  Administered 2014-10-07 (×4): 50 ug via INTRAVENOUS

## 2014-10-07 MED ORDER — ACETAMINOPHEN 325 MG PO TABS
650.0000 mg | ORAL_TABLET | ORAL | Status: DC | PRN
  Administered 2014-10-11: 650 mg via ORAL
  Filled 2014-10-07: qty 2

## 2014-10-07 MED ORDER — ONDANSETRON HCL 4 MG/2ML IJ SOLN
INTRAMUSCULAR | Status: DC | PRN
  Administered 2014-10-07: 4 mg via INTRAVENOUS

## 2014-10-07 MED ORDER — ACETAMINOPHEN 650 MG RE SUPP: 650.0000 mg | RECTAL | Status: DC | PRN

## 2014-10-07 SURGICAL SUPPLY — 73 items
BAG DECANTER FOR FLEXI CONT (MISCELLANEOUS) ×4 IMPLANT
BENZOIN TINCTURE PRP APPL 2/3 (GAUZE/BANDAGES/DRESSINGS) ×4 IMPLANT
BLADE CLIPPER SURG (BLADE) IMPLANT
BLADE SURG 11 STRL SS (BLADE) IMPLANT
BLADE ULTRA TIP 2M (BLADE) IMPLANT
BRUSH SCRUB EZ 1% IODOPHOR (MISCELLANEOUS) ×4 IMPLANT
BUR PRECISION FLUTE 6.0 (BURR) ×4 IMPLANT
CANISTER SUCT 3000ML PPV (MISCELLANEOUS) ×4 IMPLANT
CLIP TI MEDIUM 6 (CLIP) IMPLANT
CLOSURE WOUND 1/2 X4 (GAUZE/BANDAGES/DRESSINGS) ×1
COTTONBALL LRG STERILE PKG (GAUZE/BANDAGES/DRESSINGS) IMPLANT
COVER MAYO STAND STRL (DRAPES) IMPLANT
DRAIN SNY 7 FPER (WOUND CARE) IMPLANT
DRAPE CAMERA VIDEO/LASER (DRAPES) IMPLANT
DRAPE LAPAROTOMY 100X72 PEDS (DRAPES) IMPLANT
DRAPE LAPAROTOMY 100X72X124 (DRAPES) IMPLANT
DRAPE MICROSCOPE LEICA (MISCELLANEOUS) IMPLANT
DRAPE POUCH INSTRU U-SHP 10X18 (DRAPES) ×4 IMPLANT
DRAPE SURG 17X23 STRL (DRAPES) ×16 IMPLANT
ELECT BLADE 4.0 EZ CLEAN MEGAD (MISCELLANEOUS) ×4
ELECT REM PT RETURN 9FT ADLT (ELECTROSURGICAL) ×4
ELECTRODE BLDE 4.0 EZ CLN MEGD (MISCELLANEOUS) ×2 IMPLANT
ELECTRODE REM PT RTRN 9FT ADLT (ELECTROSURGICAL) ×2 IMPLANT
EVACUATOR SILICONE 100CC (DRAIN) IMPLANT
GAUZE SPONGE 4X4 12PLY STRL (GAUZE/BANDAGES/DRESSINGS) ×4 IMPLANT
GAUZE SPONGE 4X4 16PLY XRAY LF (GAUZE/BANDAGES/DRESSINGS) IMPLANT
GLOVE BIO SURGEON STRL SZ7 (GLOVE) ×4 IMPLANT
GLOVE BIO SURGEON STRL SZ8 (GLOVE) ×4 IMPLANT
GLOVE BIO SURGEON STRL SZ8.5 (GLOVE) ×4 IMPLANT
GLOVE EXAM NITRILE LRG STRL (GLOVE) IMPLANT
GLOVE EXAM NITRILE MD LF STRL (GLOVE) IMPLANT
GLOVE EXAM NITRILE XL STR (GLOVE) IMPLANT
GLOVE EXAM NITRILE XS STR PU (GLOVE) IMPLANT
GLOVE INDICATOR 7.5 STRL GRN (GLOVE) ×4 IMPLANT
GLOVE SS BIOGEL STRL SZ 7.5 (GLOVE) ×2 IMPLANT
GLOVE SUPERSENSE BIOGEL SZ 7.5 (GLOVE) ×2
GOWN STRL REUS W/ TWL LRG LVL3 (GOWN DISPOSABLE) ×2 IMPLANT
GOWN STRL REUS W/ TWL XL LVL3 (GOWN DISPOSABLE) ×2 IMPLANT
GOWN STRL REUS W/TWL LRG LVL3 (GOWN DISPOSABLE) ×2
GOWN STRL REUS W/TWL XL LVL3 (GOWN DISPOSABLE) ×2
KIT BASIN OR (CUSTOM PROCEDURE TRAY) ×4 IMPLANT
KIT ROOM TURNOVER OR (KITS) ×4 IMPLANT
NEEDLE HYPO 21X1.5 SAFETY (NEEDLE) IMPLANT
NEEDLE HYPO 22GX1.5 SAFETY (NEEDLE) ×4 IMPLANT
NEEDLE SPNL 18GX3.5 QUINCKE PK (NEEDLE) IMPLANT
NS IRRIG 1000ML POUR BTL (IV SOLUTION) ×4 IMPLANT
PACK LAMINECTOMY NEURO (CUSTOM PROCEDURE TRAY) ×4 IMPLANT
PAD ARMBOARD 7.5X6 YLW CONV (MISCELLANEOUS) ×12 IMPLANT
PAD EYE OVAL STERILE LF (GAUZE/BANDAGES/DRESSINGS) IMPLANT
PATTIES SURGICAL .25X.25 (GAUZE/BANDAGES/DRESSINGS) IMPLANT
PATTIES SURGICAL .5 X.5 (GAUZE/BANDAGES/DRESSINGS) IMPLANT
PATTIES SURGICAL .5 X3 (DISPOSABLE) IMPLANT
PATTIES SURGICAL 1/4 X 3 (GAUZE/BANDAGES/DRESSINGS) IMPLANT
PATTIES SURGICAL 1X1 (DISPOSABLE) IMPLANT
RUBBERBAND STERILE (MISCELLANEOUS) IMPLANT
SPONGE LAP 4X18 X RAY DECT (DISPOSABLE) IMPLANT
SPONGE NEURO XRAY DETECT 1X3 (DISPOSABLE) IMPLANT
STAPLER SKIN PROX WIDE 3.9 (STAPLE) ×4 IMPLANT
STRIP CLOSURE SKIN 1/2X4 (GAUZE/BANDAGES/DRESSINGS) ×3 IMPLANT
SUT ETHILON 3 0 FSL (SUTURE) ×4 IMPLANT
SUT NURALON 4 0 TR CR/8 (SUTURE) IMPLANT
SUT PROLENE 6 0 BV (SUTURE) ×4 IMPLANT
SUT SILK 3 0 TIES 17X18 (SUTURE)
SUT SILK 3-0 18XBRD TIE BLK (SUTURE) IMPLANT
SUT VIC AB 1 CT1 18XBRD ANBCTR (SUTURE) ×2 IMPLANT
SUT VIC AB 1 CT1 8-18 (SUTURE) ×2
SUT VIC AB 2-0 CP2 18 (SUTURE) ×4 IMPLANT
TAPE CLOTH SURG 4X10 WHT LF (GAUZE/BANDAGES/DRESSINGS) ×4 IMPLANT
TAPE STRIPS DRAPE STRL (GAUZE/BANDAGES/DRESSINGS) ×4 IMPLANT
TOWEL OR 17X24 6PK STRL BLUE (TOWEL DISPOSABLE) ×4 IMPLANT
TOWEL OR 17X26 10 PK STRL BLUE (TOWEL DISPOSABLE) ×4 IMPLANT
TRAY FOLEY W/METER SILVER 14FR (SET/KITS/TRAYS/PACK) IMPLANT
WATER STERILE IRR 1000ML POUR (IV SOLUTION) ×4 IMPLANT

## 2014-10-07 NOTE — Anesthesia Preprocedure Evaluation (Addendum)
Anesthesia Evaluation  Patient identified by MRN, date of birth, ID band Patient awake  General Assessment Comment:Drowsy  Reviewed: Allergy & Precautions, NPO status , Patient's Chart, lab work & pertinent test results  Airway Mallampati: III  TM Distance: >3 FB Neck ROM: Full    Dental  (+) Partial Lower, Missing, Poor Dentition, Chipped, Dental Advisory Given,    Pulmonary Current Smoker,    breath sounds clear to auscultation       Cardiovascular negative cardio ROS   Rhythm:Regular Rate:Normal     Neuro/Psych PSYCHIATRIC DISORDERS Depression Bilateral lower extremities extremely weak.  Unable to lift leg. negative neurological ROS     GI/Hepatic negative GI ROS, Neg liver ROS, GERD  Medicated,  Endo/Other  negative endocrine ROS  Renal/GU negative Renal ROS  negative genitourinary   Musculoskeletal negative musculoskeletal ROS (+) Chronic back pain.   Abdominal   Peds negative pediatric ROS (+)  Hematology negative hematology ROS (+)   Anesthesia Other Findings   Reproductive/Obstetrics negative OB ROS                          Lab Results  Component Value Date   WBC 11.4* 10/06/2014   HGB 11.2* 10/06/2014   HCT 34.2* 10/06/2014   MCV 75.3* 10/06/2014   PLT 178 10/06/2014   Lab Results  Component Value Date   CREATININE 0.52 10/06/2014   BUN 9 10/06/2014   NA 136 10/06/2014   K 2.8* 10/06/2014   CL 101 10/06/2014   CO2 25 10/06/2014   No results found for: INR, PROTIME   Anesthesia Physical Anesthesia Plan  ASA: II  Anesthesia Plan: General   Post-op Pain Management:    Induction: Intravenous  Airway Management Planned: Oral ETT  Additional Equipment:   Intra-op Plan:   Post-operative Plan: Extubation in OR  Informed Consent: I have reviewed the patients History and Physical, chart, labs and discussed the procedure including the risks, benefits and  alternatives for the proposed anesthesia with the patient or authorized representative who has indicated his/her understanding and acceptance.   Dental advisory given  Plan Discussed with: CRNA  Anesthesia Plan Comments: (Anesthetic plan discussed in detail. Associated risk discussed including but not limited to life threatening cardiovascular, pulmonary events and dental damage. The postoperative pain management and antiemetic plan discussed with patient. All questions answered in detail. Patient is in agreement.    She currently denies active cardiac symptoms as well as recent alcohol intake, PO and IV drug abuse. )       Anesthesia Quick Evaluation

## 2014-10-07 NOTE — Anesthesia Procedure Notes (Signed)
Procedure Name: Intubation Date/Time: 10/07/2014 4:02 AM Performed by: Julianne Rice Z Pre-anesthesia Checklist: Patient identified, Timeout performed, Emergency Drugs available, Suction available and Patient being monitored Patient Re-evaluated:Patient Re-evaluated prior to inductionOxygen Delivery Method: Circle system utilized Preoxygenation: Pre-oxygenation with 100% oxygen Intubation Type: IV induction Grade View: Grade I Tube type: Oral Tube size: 7.5 mm Number of attempts: 1 Airway Equipment and Method: Stylet and Video-laryngoscopy Placement Confirmation: ETT inserted through vocal cords under direct vision,  breath sounds checked- equal and bilateral and positive ETCO2 Secured at: 22 cm Tube secured with: Tape Dental Injury: Teeth and Oropharynx as per pre-operative assessment

## 2014-10-07 NOTE — Care Management Note (Signed)
Case Management Note  Patient Details  Name: Mackenzie Charles MRN: [id number] Date of Birth: 11/10/1960  Subjective/Objective:     Pt admitted on 10/06/14 with bilateral leg weakness; to OR on 10/07/14 for laminectomy and drainage of epidural abscess.  PTA, pt independent of ADLS.     Action/Plan: Will follow for discharge planning as pt progresses.    Expected Discharge Date:                  Expected Discharge Plan:  Home w Home Health Services  In-House Referral:     Discharge planning Services  CM Consult  Post Acute Care Choice:    Choice offered to:     DME Arranged:    DME Agency:     HH Arranged:    HH Agency:     Status of Service:  In process, will continue to follow  Medicare Important Message Given:    Date Medicare IM Given:    Medicare IM give by:    Date Additional Medicare IM Given:    Additional Medicare Important Message give by:     If discussed at Long Length of Stay Meetings, dates discussed:    Additional Comments:  Quintella Baton, RN, BSN  Trauma/Neuro ICU Case Manager [phone]

## 2014-10-07 NOTE — H&P (Signed)
Subjective: Patient is a 54 year old white female who has had a long history of back problems. She had a L4-5 fusion by Dr. Danielle Dess in 2006. According to the patient since then she has had "6 back surgeries" at [facility] in [place]. She says she has had back surgery "about every 2 years" at [facility]. She is not a great medical historian and can't tell me who did those surgeries. The best I can tell her last surgery was done in February 2015. Possibly by Dr. Leanord Asal.  The patient has had chronic back pain. She feels her pain has gotten worse over the last week or 2. She describes falling about a week ago and since then her back pain has been worse. The patient states that yesterday she became numb and unable to move her legs. She was [facility] [place] via ambulance and subsequent transferred to [facility] for further workup including a lumbar MRI. The lumbar MRI demonstrated she's had extensive surgery from L2-L5. She has evidence of L1-2 discitis, epidural abscess, possible herniated disc, etc. there is severe spinal stenosis at L1-2.  Past Medical History  Diagnosis Date  . Depression   . Chronic back pain     Past Surgical History  Procedure Laterality Date  . Cervical fusion      No Known Allergies  Social History  Substance Use Topics  . Smoking status: Current Every Day Smoker  . Smokeless tobacco: Not on file  . Alcohol Use: Not on file    History reviewed. No pertinent family history. Prior to Admission medications   Medication Sig Start Date End Date Taking? Authorizing Giuliana Handyside  buPROPion (WELLBUTRIN) 100 MG tablet Take 100 mg by mouth 2 (two) times daily.   Yes Historical Samreen Seltzer, MD  cyclobenzaprine (FLEXERIL) 10 MG tablet Take 10 mg by mouth 3 (three) times daily as needed for muscle spasms.   Yes Historical Erinn Huskins, MD  gabapentin (NEURONTIN) 100 MG capsule Take 100 mg by mouth 3 (three) times daily.   Yes  Historical Deloyd Handy, MD  HYDROmorphone (DILAUDID) 2 MG tablet Take by mouth every 4 (four) hours as needed for severe pain.   Yes Historical Tahsin Benyo, MD  methadone (DOLOPHINE) 10 MG tablet Take 10 mg by mouth every 8 (eight) hours.   Yes Historical Adriane Gabbert, MD  omeprazole (PRILOSEC) 40 MG capsule Take 40 mg by mouth daily.   Yes Historical Fae Blossom, MD  promethazine (PHENERGAN) 25 MG tablet Take 25 mg by mouth every 6 (six) hours as needed for nausea or vomiting.   Yes Historical Jeremaine Maraj, MD  sertraline (ZOLOFT) 100 MG tablet Take 100 mg by mouth daily.   Yes Historical Trissa Molina, MD     Review of Systems  Positive ROS: As above  All other systems have been reviewed and were otherwise negative with the exception of those mentioned in the HPI and as above.  Objective: Vital signs in last 24 hours: Temp:  [98.7 F (37.1 C)-100 F (37.8 C)] 100 F (37.8 C) (09/16 0156) Pulse Rate:  [74-88] 74 (09/16 0300) Resp:  [16-18] 16 (09/16 0156) BP: (145-153)/(77-99) 150/77 mmHg (09/16 0300) SpO2:  [94 %-100 %] 94 % (09/16 0300) Weight:  [108.863 kg (240 lb)] 108.863 kg (240 lb) (09/15 2216)  Physical exam:  General: An obese 54 year old white female who complains of back pain, leg pain, weakness, and numbness. She is in obvious distress complaining of back and leg pain.  HEENT: Normocephalic, atraumatic, extraocular  muscles intact  Neck: Supple without obvious deformities  Thorax: Symmetric  Abdomen: Soft and obese  Extremities: No obvious abnormalities  Back exam: The patient has a large well-healed lumbar incision.  Neurologic exam: The patient is alert and oriented 3. She is not entirely cooperative because of her pain. Cranial nerves II through XII are grossly intact bilaterally. Sensory function is decreased from approximately her lower abdomen distally. [name] motor strength is approximately 2-3 over 5 in her bilateral quadriceps, gastrocnemius, and dorsiflexors.  I  have reviewed the patient's lumbar MRI performed at [facility] today. The patient has had a lumbar fusion from L2-L5. She has a grade 1 spondylolisthesis at L5-S1. At L1-2 she appears to have discitis, osteomyelitis, herniated disc, etc. She has severe spinal stenosis at that level.   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 I have recommended a lumbar laminectomy for drainage of the epidural abscess, obtaining cultures, etc. I have described surgery to her. We have discussed the risks of surgery including the risks of anesthesia, infection, spinal fluid leak, worsening weakness, paralysis, medical risk, etc. I have answered all her questions. She has decided to proceed with surgery. Once her infection clears she will need to go back to [facility] for further management of her chronic pain.   JENKINS,JEFFREY D 10/07/2014 3:39 AM

## 2014-10-07 NOTE — Progress Notes (Signed)
PT Cancellation Note  Patient Details Name: Mackenzie Charles MRN: [id number] DOB: [birth year]   Cancelled Treatment:    Reason Eval/Treat Not Completed: Medical issues which prohibited therapy (Per nurse, MD desires bedrest until tomorrow am.  Will defer evaluation until tomorrow am.) Thanks.  Tawni Millers F 10/07/2014, 11:59 AM [facility] [phone] (pager)

## 2014-10-07 NOTE — Op Note (Signed)
Brief history: The patient is a 54 year old white female who's had, by her account, 7 previous back surgeries at another institution. She developed progressive back pain with progressive lower extremity numbness, and weakness. She was seen in the ER and worked up with a lumbar MRI which demonstrated a L1-2 discitis and epidural abscess. I discussed the situation with the patient. We discussed the various treatment options including surgery. I recommend surgery. The patient has weighed the risks, benefits, and alternative surgery and decided to proceed with surgery.  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 Delma Officer  Assistant: None  Anesthesia: Gen. endotracheal  Estimated blood loss: Minimal  Specimens: Epidural cultures  Drains: None  Complications: None  Description of procedure: The patient was brought to the operating room by the anesthesia team. General endotracheal anesthesia was induced. The patient was turned to the prone position on the Wilson frame. Her lumbosacral region was then prepared with Betadine scrub and Betadine solution. Sterile drapes were applied. We obtained intraoperative radiograph to help [place] determine where to make the incision. I then used a scalpel to make a linear midline incision over the L1-2 interspace incising through the old surgical scar. I used electrocautery to perform a bilateral subperiosteal dissection exposing the spinous process lamina of L1 and the remainder of the L2 lamina. We obtained another intraoperative radiograph to confirm location. I inserted the McCullough retractor for exposure. I used a high-speed drill to perform a left L1 laminotomy. I removed the ligamentum flavum at L1-2. I removed the cephalad aspect of the remainder of the L2 lamina. There was quite a bit of scar tissue from the old surgery. I  dissected ventral to the thecal sac and encountered an epidural abscess. We obtained cultures. The patient was then given 2 g of cefazolin. I dissected in the ventral epidural space and removed the abscess and the granulation tissue decompressing the thecal sac. I then obtained hemostasis using bipolar cautery. We irrigated the wound out with bacitracin solution. We removed the retractors. I placed vancomycin powder in the wound. I then reapproximated patient's thoracic lumbar fascia with interrupted #1 Michael suture. I reapproximated the subcutaneous tissue with interrupted 20 vertical suture. I reapproximated the skin with Steri-Strips and benzoin. The wound was then coated with bacitracin ointment. A sterile dressing was applied. The drapes were removed. The patient was returned to the supine position. The patient was subsequently extubated by the anesthesia team and transported to the post anesthesia care unit in stable condition.

## 2014-10-07 NOTE — ED Notes (Signed)
MD at bedside, Dr. Rhunette Croft.

## 2014-10-07 NOTE — ED Notes (Signed)
Family at bedside. 

## 2014-10-07 NOTE — Transfer of Care (Signed)
Immediate Anesthesia Transfer of Care Note  Patient: Mackenzie Charles  Procedure(s) Performed: Procedure(s): LUMBAR LAMINECTOMY  Lumbar one - two  Patient Location: NICU  Anesthesia Type:General  Level of Consciousness: awake and sedated  Airway & Oxygen Therapy: Patient Spontanous Breathing and Patient connected to face mask oxygen  Post-op Assessment: Report given to RN and Post -op Vital signs reviewed and stable  Post vital signs: Reviewed and stable  Last Vitals:  Filed Vitals:   10/07/14 0300  BP: 150/77  Pulse: 74  Temp:   Resp:     Complications: No apparent anesthesia complications

## 2014-10-07 NOTE — Anesthesia Postprocedure Evaluation (Signed)
  Anesthesia Post-op Note  Patient: Mackenzie Charles  Procedure(s) Performed: Procedure(s): LUMBAR LAMINECTOMY  Lumbar one - two  Patient Location: PACU  Anesthesia Type:General  Level of Consciousness: awake, alert  and oriented  Airway and Oxygen Therapy: Patient Spontanous Breathing  Post-op Pain: mild  Post-op Assessment: Post-op Vital signs reviewed and Patient's Cardiovascular Status Stable LLE Motor Response: Purposeful movement LLE Sensation: No numbness RLE Motor Response: Purposeful movement RLE Sensation: No numbness      Post-op Vital Signs: Reviewed and stable  Last Vitals:  Filed Vitals:   10/07/14 0715  BP: 138/72  Pulse: 65  Temp: 36.8 C  Resp: 19    Complications: No apparent anesthesia complications

## 2014-10-07 NOTE — ED Notes (Signed)
Patient transported to MRI 

## 2014-10-07 NOTE — ED Provider Notes (Signed)
This chart was scribed for  Derwood Kaplan, MD by Bethel Born, ED Scribe. This patient was seen in room C29C/C29C and the patient's care was started at 2:00 AM.  Pt was transferred from [facility] for a MRI. Pt presents for increasing LE weakness and chronic back pain exacerbation. She has history of multiple spinal fusions at [facility] with the last being 1 year ago. Pt has been unable to move her legs or walk. Associated symptoms include LE numbness to the knees. No history of DM. She has no history of IV drug use and is on Methadone for pain management.   2:04 AM-Consult complete with Dr. Lovell Sheehan (Neurosurgery). I updated Dr. Lovell Sheehan on the MRI results and the neuro findings. He will review the images himself. Call ended at 2:09 AM.   I have requested records from [facility] as per Dr. Lovell Sheehan request.  2:28 AM D/w Dr. Lovell Sheehan, he has activated the OR. No antibiotics for now.  2:29 AM i updated the patient on the plan for surgery. I called her daughter at the bedside and updated her on the patient's condition and the treatment plan.    I personally performed the services described in this documentation, which was scribed in my presence. The recorded information has been reviewed and is accurate.      Derwood Kaplan, MD 10/07/14 [phone]

## 2014-10-07 NOTE — Progress Notes (Signed)
Subjective:  The patient is very somnolent but arousable. She is in no apparent distress.  Objective: Vital signs in last 24 hours: Temp:  [98.7 F (37.1 C)-100 F (37.8 C)] 99.4 F (37.4 C) (09/16 0636) Pulse Rate:  [69-88] 69 (09/16 0636) Resp:  [16-18] 16 (09/16 0156) BP: (132-153)/(73-99) 132/73 mmHg (09/16 0636) SpO2:  [94 %-100 %] 95 % (09/16 0636) Weight:  [213.086 kg (240 lb)] 108.863 kg (240 lb) (09/15 2216)  Intake/Output from previous day: 09/15 0701 - 09/16 0700 In: 1600 [I.V.:1600] Out: 2080 [Urine:1930; Blood:150] Intake/Output this shift: Total I/O In: 1600 [I.V.:1600] Out: 2080 [Urine:1930; Blood:150]  Physical exam the patient is somnolent but arousable. She will wiggle her toes bilaterally.  Lab Results:  Recent Labs  10/06/14 2250  WBC 11.4*  HGB 11.2*  HCT 34.2*  PLT 178   BMET  Recent Labs  10/06/14 2250  NA 136  K 2.8*  CL 101  CO2 25  GLUCOSE 109*  BUN 9  CREATININE 0.52  CALCIUM 8.7*    Studies/Results: Mr Lumbar Spine W Wo Contrast  10/07/2014   CLINICAL DATA:  Moderate chronic back pain, with worsening lower extremity weakness over 1 day. Worsening leg pain today.  EXAM: MRI LUMBAR SPINE WITHOUT AND WITH CONTRAST  TECHNIQUE: Multiplanar and multiecho pulse sequences of the lumbar spine were obtained without and with intravenous contrast. Per technologist note, patient had received maximum medication.  CONTRAST:  20mL MULTIHANCE GADOBENATE DIMEGLUMINE 529 MG/ML IV SOLN  COMPARISON:  CT myelogram April 13, 2004  FINDINGS: Moderate to severely motion degraded examination, further degraded by posterior instrumentation from L2 through S1. Low T1, bright STIR signal within the L1-2 disc, and edema within the L1 vertebral body. Abnormal L1 enhancement. In addition, increasing L5-S1 anterolisthesis, interval posterior instrumentation with enhancement about the interbody fusion material. Solid L4-5 interbody fusion. Moderate to severe L3-4 disc  height loss, progressed.  5.2 x 1.2 x 1.2 cm (cc by AP by transverse) ventral epidural fluid collection at L1 through superior endplate of L3, with peripheral enhancement consistent with epidural abscess. Abscess results in severe canal stenosis at L1-2, nearly completely effacing the spinal canal. Abnormal T2 bright signal and enhancement within the bilateral mesial iliopsoas muscles, sub cm focal fluid collections. Severe paraspinal muscle atrophy.  IMPRESSION: Moderate to severely motion degraded examination.  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 Ventral epidural abscess L1 through L3 resulting in severe canal stenosis at L1-2. Bilateral iliopsoas myositis and, subcentimeter intramuscular abscesses bilaterally.  Status post L2 through S1 PLIF.  Grade 1 L5-S1 anterolisthesis, with enhancement about the interbody disc material consistent with motion/hardware failure. No convincing evidence of infection.  Acute findings discussed with and reconfirmed by Dr.YELVERTON on 10/07/2014 at 2:13 am.   Electronically Signed   By: Awilda Metro M.D.   On: 10/07/2014 02:15    Assessment/Plan: The patient appears to be doing well. I'll ask infectious disease to see her.  LOS: 0 days     Wilfredo Canterbury D 10/07/2014, 6:59 AM

## 2014-10-08 MED ORDER — DIPHENHYDRAMINE HCL 25 MG PO CAPS
25.0000 mg | ORAL_CAPSULE | ORAL | Status: DC | PRN
  Administered 2014-10-08 – 2014-10-11 (×6): 25 mg via ORAL
  Filled 2014-10-08 (×6): qty 1

## 2014-10-08 NOTE — Progress Notes (Signed)
Patient ID: Mackenzie Charles, female   DOB: 1960-01-26, 54 y.o.   MRN: [id number] Vital signs are stable Motor function remains fairly poor with one doesn't ooze in the distal lower extremities Sensation appears intact Some dysesthesias noted Will have Foley catheter removed this morning Continue supportive care

## 2014-10-08 NOTE — Evaluation (Addendum)
Physical Therapy Evaluation Patient Details Name: Mackenzie Charles MRN: [id number] DOB: 04/24/1960 Today's Date: 10/08/2014   History of Present Illness  Pt admitted on 10/06/14 with bilateral leg weakness; to OR on 10/07/14 for laminectomy and drainage of epidural abscess. PTA, pt independent of ADLS  Clinical Impression  Patient demonstrates deficits in functional mobility as indicated below. Will need continued skilled PT to address deficits and maximize function. Will see as indicated and progress as tolerated.  OF NOTE: Patient with significantly limited ability to take on weight through BLEs, attempted standing with RW, patient unable to reach upright, required +2 physical assist and Bilateral LE blocking with gait belt and chuck pad to initiate a transfer. Recommend use of lift equipment to return to bed.     Follow Up Recommendations CIR;Supervision/Assistance - 24 hour    Equipment Recommendations  Other (comment) (TBD)    Recommendations for Other Services Rehab consult     Precautions / Restrictions Precautions Precautions: Back;Fall      Mobility  Bed Mobility Overal bed mobility: Needs Assistance;+2 for physical assistance Bed Mobility: Rolling;Supine to Sit Rolling: Mod assist;+2 for physical assistance            Transfers Overall transfer level: Needs assistance Equipment used: 2 person hand held assist Transfers: Sit to/from Stand;Stand Pivot Transfers Sit to Stand: Max assist;+2 physical assistance;From elevated surface Stand pivot transfers: Max assist;+2 physical assistance;From elevated surface       General transfer comment: 2 person face to face with chuck pad, gait belt, and BLE blocking  Ambulation/Gait                Stairs            Wheelchair Mobility    Modified Rankin (Stroke Patients Only)       Balance Overall balance assessment: History of Falls (able to sit EOB with LE movement)                                            Pertinent Vitals/Pain Pain Assessment: 0-10 Pain Score: 10-Worst pain ever Pain Location: back, Bilateral LEs Pain Descriptors / Indicators: Aching;Burning;Discomfort;Grimacing;Guarding Pain Intervention(s): Limited activity within patient's tolerance;Monitored during session;Repositioned;Relaxation;Patient requesting pain meds-RN notified    Home Living Family/patient expects to be discharged to:: Inpatient rehab Living Arrangements: Children;Parent Available Help at Discharge: Family Type of Home: Mobile home Home Access: Stairs to enter Entrance Stairs-Rails: Right Entrance Stairs-Number of Steps: 2 Home Layout: One level        Prior Function Level of Independence: Independent         Comments: patient states she was walking a few days ago     Hand Dominance   Dominant Hand: Right    Extremity/Trunk Assessment   Upper Extremity Assessment: Overall WFL for tasks assessed           Lower Extremity Assessment: RLE deficits/detail;LLE deficits/detail RLE Deficits / Details: Limited strength, dorsiflexion 2-/5, knee extension 2+/5 in sitting LLE Deficits / Details: Limited strength, dorsiflexion stronger on left but remains diminished 2/5, knee extension 2+/5 in sitting  Cervical / Trunk Assessment:  (increased body habitus)  Communication   Communication: No difficulties  Cognition Arousal/Alertness: Awake/alert Behavior During Therapy: Anxious Overall Cognitive Status: No family/caregiver present to determine baseline cognitive functioning  General Comments      Exercises        Assessment/Plan    PT Assessment Patient needs continued PT services  PT Diagnosis Difficulty walking;Abnormality of gait;Generalized weakness;Acute pain   PT Problem List Decreased strength;Decreased range of motion;Decreased activity tolerance;Decreased balance;Decreased mobility;Decreased  coordination;Decreased cognition;Decreased safety awareness;Decreased knowledge of precautions;Impaired sensation;Impaired tone;Obesity;Pain  PT Treatment Interventions DME instruction;Gait training;Stair training;Functional mobility training;Therapeutic activities;Therapeutic exercise;Balance training;Patient/family education   PT Goals (Current goals can be found in the Care Plan section) Acute Rehab PT Goals Patient Stated Goal: to go home PT Goal Formulation: With patient Time For Goal Achievement: 10/22/14 Potential to Achieve Goals: Good    Frequency Min 5X/week   Barriers to discharge Decreased caregiver support lives with mom    Co-evaluation               End of Session Equipment Utilized During Treatment: Gait belt Activity Tolerance: Patient limited by fatigue;Patient limited by pain Patient left: in chair;with call bell/phone within reach Nurse Communication: Mobility status;Need for lift equipment         Time: 1134-1201 PT Time Calculation (min) (ACUTE ONLY): 27 min   Charges:   PT Evaluation $Initial PT Evaluation Tier I: 1 Procedure PT Treatments $Therapeutic Activity: 8-22 mins   PT G CodesFabio Asa 2014/10/20, 4:25 PM Charlotte Crumb, PT DPT  [phone]

## 2014-10-09 LAB — URINE CULTURE: Culture: >=100000

## 2014-10-09 NOTE — Progress Notes (Addendum)
Rehab Admissions Coordinator Note:  Patient was screened by Clois Dupes for appropriateness for an Inpatient Acute Rehab Consult per PT recommendation.  At this time, we are recommending Inpatient Rehab consult. Consult will be completed Monday.  Clois Dupes 10/09/2014, 1:35 PM  I can be reached at [phone].

## 2014-10-09 NOTE — Progress Notes (Signed)
Patient ID: Mackenzie Charles, female   DOB: [birth year], 54 y.o.   MRN: [id number] Vital signs are stable Level of alertness appears somewhat better today Motor function also appears better with trace hip flexors at 2 out of 5 and foot movement at 2 out of 5 in dorsi and plantar flexion. No antigravity strength yet Continue supportive care Will likely need CIR

## 2014-10-10 ENCOUNTER — Encounter (HOSPITAL_COMMUNITY): Payer: Self-pay | Admitting: Neurosurgery

## 2014-10-10 DIAGNOSIS — M5106 Intervertebral disc disorders with myelopathy, lumbar region: Secondary | ICD-10-CM

## 2014-10-10 DIAGNOSIS — M4646 Discitis, unspecified, lumbar region: Secondary | ICD-10-CM

## 2014-10-10 MED ORDER — CHLORHEXIDINE GLUCONATE CLOTH 2 % EX PADS
6.0000 | MEDICATED_PAD | Freq: Every day | CUTANEOUS | Status: DC
  Administered 2014-10-10: 6 via TOPICAL

## 2014-10-10 MED ORDER — MUPIROCIN 2 % EX OINT
1.0000 "application " | TOPICAL_OINTMENT | Freq: Two times a day (BID) | CUTANEOUS | Status: DC
  Administered 2014-10-10 – 2014-10-12 (×5): 1 via NASAL
  Filled 2014-10-10: qty 22

## 2014-10-10 MED ORDER — RIFAMPIN 300 MG PO CAPS
300.0000 mg | ORAL_CAPSULE | Freq: Every day | ORAL | Status: DC
  Administered 2014-10-10 – 2014-10-12 (×3): 300 mg via ORAL
  Filled 2014-10-10 (×3): qty 1

## 2014-10-10 MED ORDER — SODIUM CHLORIDE 0.9 % IJ SOLN
10.0000 mL | INTRAMUSCULAR | Status: DC | PRN
  Administered 2014-10-11 – 2014-10-12 (×3): 10 mL
  Filled 2014-10-10 (×3): qty 40

## 2014-10-10 NOTE — Progress Notes (Signed)
Physical Therapy Treatment Patient Details Name: Mackenzie Charles MRN: [id number] DOB: 08-23-1960 Today's Date: 10/10/2014    History of Present Illness Pt admitted on 10/06/14 with bilateral leg weakness; to OR on 10/07/14 for laminectomy and drainage of epidural abscess. PTA, pt independent of ADLS    PT Comments    Patient progressing slowly.  Complained of unable to turn herself in bed upon my entry.  Still very little LE movement, but has tingling sensations, but poor awareness/proprioception in sitting with feet supported on floor.  Needs continued interdisciplinary post acute rehab prior to d/c home with family assist.  Follow Up Recommendations  CIR;Supervision/Assistance - 24 hour     Equipment Recommendations  Other (comment) (TBA)    Recommendations for Other Services       Precautions / Restrictions Precautions Precautions: Back;Fall    Mobility  Bed Mobility Overal bed mobility: Needs Assistance;+2 for physical assistance Bed Mobility: Rolling;Sidelying to Sit;Sit to Sidelying Rolling: Mod assist Sidelying to sit: Max assist;+2 for physical assistance     Sit to sidelying: Max assist;+2 for physical assistance General bed mobility comments:   cues for initiation reaching to rail and assist with cues for pushing up with UE's and rail from sidelying.  Patient c/o severe pain with side to sit  Transfers Overall transfer level: Needs assistance   Transfers: Sit to/from Stand Sit to Stand: Max assist;+2 physical assistance         General transfer comment: use of pad and assist to block knees, cues for chest upright  Ambulation/Gait             General Gait Details: unable to ambulate   Stairs            Wheelchair Mobility    Modified Rankin (Stroke Patients Only)       Balance Overall balance assessment: Needs assistance Sitting-balance support: Bilateral upper extremity supported Sitting balance-Leahy Scale: Poor Sitting  balance - Comments: minguard to mod assist for balance at edge of bed with UE support and c/o pain in hips and low back     Standing balance-Leahy Scale: Zero                      Cognition Arousal/Alertness: Awake/alert Behavior During Therapy: Anxious Overall Cognitive Status: Within Functional Limits for tasks assessed                      Exercises Other Exercises Other Exercises: stabilized attempted bridging with assist x 7 reps    General Comments        Pertinent Vitals/Pain Pain Score: 10-Worst pain ever Pain Location: severe in back and right hip with movement, repositioning decreased pain Pain Descriptors / Indicators: Aching;Constant;Tingling Pain Intervention(s): Monitored during session;Repositioned    Home Living                      Prior Function            PT Goals (current goals can now be found in the care plan section) Progress towards PT goals: Progressing toward goals    Frequency  Min 3X/week    PT Plan Current plan remains appropriate;Frequency needs to be updated    Co-evaluation             End of Session Equipment Utilized During Treatment: Gait belt Activity Tolerance: Patient limited by pain Patient left: in bed;with call bell/phone within reach     Time:  1914-7829 PT Time Calculation (min) (ACUTE ONLY): 25 min  Charges:  $Therapeutic Activity: 23-37 mins                    G Codes:      WYNN,CYNDI 11/07/2014, 3:44 PM  Sheran Lawless, PT [phone] 11/07/14

## 2014-10-10 NOTE — Progress Notes (Signed)
Rehab admissions - Please see rehab consult done today by Dr. Riley Kill.  I will follow up in am with patient for potential inpatient rehab admission.  Currently rehab beds are limited.  Call me for questions.  #[phone]

## 2014-10-10 NOTE — Consult Note (Signed)
Physical Medicine and Rehabilitation Consult Reason for Consult: L1-2 discitis, epidural abscess, paraparesis Referring Physician: Dr. Danielle Dess   HPI: Mackenzie Charles is a 54 y.o. right handed female with a long history of chronic back pain. Patient has been living in [facility] and recently staying with family in [place]. She has been using a cane and sometimes walker due to her back pain. Patient with L4-5 fusion 2006 per Dr. Danielle Dess followed by multiple back surgeries at [facility]. Presented 10/07/2014 with increasing low back pain over the past 2 weeks with recent fall due to back pain with increasing numbness and inability to ambulate. MRI imaging demonstrates patient has had extensive surgeries from L2-L5. Evidence of L1-2 discitis, epidural abscess possible herniated disc as well as severe spinal stenosis at L1-2. Underwent L1 and L2 laminectomy for drainage of epidural abscess 10/07/2014 per Dr. Tressie Stalker. Hospital course pain management. MRSA PCR screen positive placed on contact precautions. Urine culture greater than 100,000 Escherichia coli placed on anti-biotic therapy. Physical therapy evaluation completed 10/08/2014 with recommendations of physical medicine rehabilitation consult.   Review of Systems  Constitutional: Negative for fever and chills.  HENT: Negative for hearing loss.   Eyes: Negative for blurred vision and double vision.  Respiratory: Negative for cough and shortness of breath.   Cardiovascular: Negative for chest pain, palpitations and leg swelling.  Gastrointestinal: Positive for constipation. Negative for nausea and vomiting.  Musculoskeletal: Positive for myalgias, back pain and falls.  Skin: Negative for rash.  Neurological: Negative for seizures, loss of consciousness and headaches.  Psychiatric/Behavioral: Positive for depression.   Past Medical History  Diagnosis Date  . Depression   . Chronic back pain    Past Surgical  History  Procedure Laterality Date  . Cervical fusion     History reviewed. No pertinent family history. Social History:  reports that she has been smoking.  She does not have any smokeless tobacco history on file. Her alcohol and drug histories are not on file. Allergies: No Known Allergies Medications Prior to Admission  Medication Sig Dispense Refill  . buPROPion (WELLBUTRIN) 100 MG tablet Take 100 mg by mouth 2 (two) times daily.    . cyclobenzaprine (FLEXERIL) 10 MG tablet Take 10 mg by mouth 3 (three) times daily as needed for muscle spasms.    Marland Kitchen gabapentin (NEURONTIN) 100 MG capsule Take 100 mg by mouth 3 (three) times daily.    Marland Kitchen HYDROmorphone (DILAUDID) 2 MG tablet Take by mouth every 4 (four) hours as needed for severe pain.    . methadone (DOLOPHINE) 10 MG tablet Take 10 mg by mouth every 8 (eight) hours.    Marland Kitchen omeprazole (PRILOSEC) 40 MG capsule Take 40 mg by mouth daily.    . promethazine (PHENERGAN) 25 MG tablet Take 25 mg by mouth every 6 (six) hours as needed for nausea or vomiting.    . sertraline (ZOLOFT) 100 MG tablet Take 100 mg by mouth daily.      Home: Home Living Family/patient expects to be discharged to:: Inpatient rehab Living Arrangements: Children, Parent Available Help at Discharge: Family Type of Home: Mobile home Home Access: Stairs to enter Secretary/administrator of Steps: 2 Entrance Stairs-Rails: Right Home Layout: One level Bathroom Shower/Tub: Engineer, manufacturing systems: Standard  Functional History: Prior Function Level of Independence: Independent Comments: patient states she was walking a few days ago Functional Status:  Mobility: Bed Mobility Overal bed mobility: Needs Assistance, +2 for physical assistance Bed  Mobility: Rolling, Supine to Sit Rolling: Mod assist, +2 for physical assistance General bed mobility comments: increased time, assist to initiate LEs repositioning, patient able to use UE to pull on rail during roll and  assist to elevate trunk to upright Transfers Overall transfer level: Needs assistance Equipment used: 2 person hand held assist Transfers: Sit to/from Stand, Stand Pivot Transfers Sit to Stand: Max assist, +2 physical assistance, From elevated surface Stand pivot transfers: Max assist, +2 physical assistance, From elevated surface General transfer comment: 2 person face to face with chuck pad, gait belt, and BLE blocking      ADL:    Cognition: Cognition Overall Cognitive Status: No family/caregiver present to determine baseline cognitive functioning Orientation Level: Oriented X4 Cognition Arousal/Alertness: Awake/alert Behavior During Therapy: Anxious Overall Cognitive Status: No family/caregiver present to determine baseline cognitive functioning  Blood pressure 121/58, pulse 65, temperature 98.4 F (36.9 C), temperature source Oral, resp. rate 26, height  (1.676 m), weight 112.4 kg (247 lb 12.8 oz), SpO2 94 %. Physical Exam  Constitutional: She is oriented to person, place, and time. She appears well-developed.  HENT:  Head: Normocephalic.  Eyes: EOM are normal.  Neck: Normal range of motion. Neck supple. No thyromegaly present.  Cardiovascular: Normal rate and regular rhythm.   Respiratory: Effort normal and breath sounds normal. No respiratory distress.  GI: Soft. Bowel sounds are normal. She exhibits no distension.  Neurological: She is alert and oriented to person, place, and time.  Follows commands, decreased sensation to LT and PP in both feet. Has more difficulties with pain and movement of LLE---inconsistent effort due to pain> strength grossly 2-3/5 RLE and 2- LLE. UE strength normal. Cognition appropriat  Skin:  Back incision is dressed  Psychiatric: She has a normal mood and affect. Her behavior is normal. Thought content normal.    No results found for this or any previous visit (from the past 24 hour(s)). No results  found.  Assessment/Plan: Diagnosis: lumbar epidural abscess, diskitis, stenosis s/p decompression with myelopathic signs 1. Does the need for close, 24 hr/day medical supervision in concert with the patient's rehab needs make it unreasonable for this patient to be served in a less intensive setting? Yes 2. Co-Morbidities requiring supervision/potential complications: pain, id, wound care 3. Due to bladder management, bowel management, safety, skin/wound care, disease management, medication administration, pain management and patient education, does the patient require 24 hr/day rehab nursing? Yes 4. Does the patient require coordinated care of a physician, rehab nurse, PT (1-2 hrs/day, 5 days/week) and OT (1-2 hrs/day, 5 days/week) to address physical and functional deficits in the context of the above medical diagnosis(es)? Yes Addressing deficits in the following areas: balance, endurance, locomotion, strength, transferring, bowel/bladder control, bathing, dressing, feeding, grooming, toileting and psychosocial support 5. Can the patient actively participate in an intensive therapy program of at least 3 hrs of therapy per day at least 5 days per week? Yes 6. The potential for patient to make measurable gains while on inpatient rehab is excellent 7. Anticipated functional outcomes upon discharge from inpatient rehab are modified independent  to supervision with PT, modified independent and supervision with OT, n/a with SLP. 8. Estimated rehab length of stay to reach the above functional goals is: 12-18 days 9. Does the patient have adequate social supports and living environment to accommodate these discharge functional goals? Yes 10. Anticipated D/C setting: Home 11. Anticipated post D/C treatments: HH therapy and Outpatient therapy 12. Overall Rehab/Functional Prognosis: excellent  RECOMMENDATIONS: This  patient's condition is appropriate for continued rehabilitative care in the following  setting: CIR Patient has agreed to participate in recommended program. Yes Note that insurance prior authorization may be required for reimbursement for recommended care.  Comment: Rehab Admissions Coordinator to follow up.  Thanks,  Ranelle Oyster, MD, [place]     10/10/2014

## 2014-10-10 NOTE — Consult Note (Addendum)
[facility] for Infectious Disease  Date of Admission:  10/06/2014  Date of Consult:  10/10/2014  Reason for Consult: Discitis Referring Physician: Arnoldo Morale  Impression/Recommendation Diskitis, abscess, myositis with hardware Viridans streptococci infection UTI, E coli  Would Place PIC Continue ceftriaxone Add rifampin Check her LFTs Will have her f/u in ID clinic in 6 weeks.   Thank you so much for this interesting consult,   Bobby Rumpf (pager) [phone] [email or web address]  Mackenzie Charles is an 54 y.o. female.  HPI: 54 yo F with hx of L4-5 2006 followed by 6 back surgeries at [facility]. Last 02-2013.  Over 2 weeks prior to adm, she has had worsening back pain after a fall, to the point she developed numbness and weakness in her LE.  On adm 9-16 her MRI showed L1-2 discitis, epidural abscess. Her Temp was 100.0 and her WBC was 11.4.  She was taken to OR on 9-16 and underwent I & D of her abscess. Her Cx grew viridans streptococci.  Her UCx grew > 100k E coli (R-unasyn, bactrim).  She was started on vancomycin/ceftriaxone 9-16, this was changed to ceftriaxone alone on 9-16.    Past Medical History  Diagnosis Date  . Depression   . Chronic back pain     Past Surgical History  Procedure Laterality Date  . Cervical fusion    . Lumbar laminectomy/decompression microdiscectomy  10/07/2014    Procedure: LUMBAR LAMINECTOMY  Lumbar one - two;  Surgeon: Newman Pies, MD;  Location: [facility];  Service: Neurosurgery;;     No Known Allergies  Medications:  Scheduled: . buPROPion  100 mg Oral BID  . cefTRIAXone (ROCEPHIN)  IV  2 g Intravenous Q24H  . Chlorhexidine Gluconate Cloth  6 each Topical Q0600  . docusate sodium  100 mg Oral BID  . fentaNYL  75 mcg Transdermal Q72H  . gabapentin  100 mg Oral TID  . methadone  10 mg Oral 3 times per day  . mupirocin ointment  1 application Nasal BID  . pantoprazole  80 mg Oral Daily  . sertraline  100 mg  Oral Daily    Abtx:  Anti-infectives    Start     Dose/Rate Route Frequency Ordered Stop   10/07/14 1200  vancomycin (VANCOCIN) 1,500 mg in sodium chloride 0.9 % 500 mL IVPB     1,500 mg 250 mL/hr over 120 Minutes Intravenous  Once 10/07/14 0746 10/07/14 1404   10/07/14 0900  cefTRIAXone (ROCEPHIN) 2 g in dextrose 5 % 50 mL IVPB     2 g 100 mL/hr over 30 Minutes Intravenous Every 24 hours 10/07/14 0738     10/07/14 0607  vancomycin (VANCOCIN) powder  Status:  Discontinued       As needed 10/07/14 0607 10/07/14 0630   10/07/14 0457  bacitracin 50,000 Units in sodium chloride irrigation 0.9 % 500 mL irrigation  Status:  Discontinued       As needed 10/07/14 0457 10/07/14 0630   10/07/14 0427  vancomycin (VANCOCIN) 1000 MG powder    Comments:  Aydelette, Jamie   : cabinet override      10/07/14 0427 10/07/14 1644      Total days of antibiotics: 3 ceftriaxone          Social History:  reports that she has been smoking Cigarettes.  She has been smoking about 0.30 packs per day. She does not have any smokeless tobacco history on file. Her alcohol and drug histories are not  on file.  History reviewed. No pertinent family history- she denies DM, HTN, Cancer.  General ROS: anorexia, no f/c, no dysuria, no BM in 3 days. +neuropathy, +weakness.  Please see HPI. 12 point ROS o/w (-)  Blood pressure 117/77, pulse 68, temperature 98.1 F (36.7 C), temperature source Oral, resp. rate 18, height _0  (1.676 m), weight 112.4 kg (247 lb 12.8 oz), SpO2 98 %. General appearance: alert, cooperative and no distress Eyes: negative findings: conjunctivae and sclerae normal and pupils equal, round, reactive to light and accomodation Throat: normal findings: oropharynx pink & moist without lesions or evidence of thrush Neck: no adenopathy and supple, symmetrical, trachea midline Lungs: clear to auscultation bilaterally Heart: regular rate and rhythm Abdomen: normal findings: bowel sounds normal and  soft, non-tender Extremities: edema none. decreased light touch BLE.  Skin: her wound is closed, there is no d/c.    No results found for this or any previous visit (from the past 48 hour(s)).    Component Value Date/Time   SDES BLOOD LEFT ANTECUBITAL 10/07/2014 0955   SPECREQUEST BOTTLES DRAWN AEROBIC AND ANAEROBIC 5CC 10/07/2014 0955   CULT NO GROWTH 3 DAYS 10/07/2014 0955   REPTSTATUS PENDING 10/07/2014 0955   No results found. Recent Results (from the past 240 hour(s))  Urine culture     Status: None   Collection Time: 10/06/14 10:50 PM  Result Value Ref Range Status   Specimen Description URINE, CLEAN CATCH  Final   Special Requests NONE  Final   Culture   Final    >=100,000 COLONIES/mL ESCHERICHIA COLI Performed at [facility]    Report Status 10/09/2014 FINAL  Final   Organism ID, Bacteria ESCHERICHIA COLI  Final      Susceptibility   Escherichia coli - MIC*    AMPICILLIN >=32 RESISTANT Resistant     CEFAZOLIN 8 SENSITIVE Sensitive     CEFTRIAXONE <=1 SENSITIVE Sensitive     CIPROFLOXACIN <=0.25 SENSITIVE Sensitive     GENTAMICIN <=1 SENSITIVE Sensitive     IMIPENEM <=0.25 SENSITIVE Sensitive     NITROFURANTOIN <=16 SENSITIVE Sensitive     TRIMETH/SULFA >=320 RESISTANT Resistant     AMPICILLIN/SULBACTAM >=32 RESISTANT Resistant     PIP/TAZO 16 SENSITIVE Sensitive     * >=100,000 COLONIES/mL ESCHERICHIA COLI  Anaerobic culture     Status: None (Preliminary result)   Collection Time: 10/07/14  5:22 AM  Result Value Ref Range Status   Specimen Description WOUND  Final   Special Requests LUMBAR   Final   Gram Stain   Final    FEW WBC PRESENT, PREDOMINANTLY PMN NO SQUAMOUS EPITHELIAL CELLS SEEN FEW GRAM POSITIVE COCCI IN PAIRS Performed at [facility]    Culture   Final    NO ANAEROBES ISOLATED; CULTURE IN PROGRESS FOR 5 DAYS Performed at [facility]    Report Status PENDING  Incomplete  Gram stain     Status: None   Collection Time:  10/07/14  5:22 AM  Result Value Ref Range Status   Specimen Description WOUND  Final   Special Requests LUMBAR  Final   Gram Stain   Final    RARE WBC PRESENT, PREDOMINANTLY PMN NO ORGANISMS SEEN    Report Status 10/07/2014 FINAL  Final  Wound culture     Status: None (Preliminary result)   Collection Time: 10/07/14  5:22 AM  Result Value Ref Range Status   Specimen Description WOUND  Final   Special Requests LUMBAR  Final   Gram Stain PENDING  Incomplete   Culture   Final    MODERATE VIRIDANS STREPTOCOCCUS Performed at [facility]    Report Status PENDING  Incomplete  Culture, blood (single)     Status: None (Preliminary result)   Collection Time: 10/07/14  9:55 AM  Result Value Ref Range Status   Specimen Description BLOOD LEFT ANTECUBITAL  Final   Special Requests BOTTLES DRAWN AEROBIC AND ANAEROBIC 5CC  Final   Culture NO GROWTH 3 DAYS  Final   Report Status PENDING  Incomplete  MRSA PCR Screening     Status: Abnormal   Collection Time: 10/07/14 10:40 AM  Result Value Ref Range Status   MRSA by PCR POSITIVE (A) NEGATIVE Final    Comment:        The GeneXpert MRSA Assay (FDA approved for NASAL specimens only), is one component of a comprehensive MRSA colonization surveillance program. It is not intended to diagnose MRSA infection nor to guide or monitor treatment for MRSA infections. RESULT CALLED TO, READ BACK BY AND VERIFIED WITH: SUMMERELL,P RN @ [phone] 10/07/14 LEONARD,A       10/10/2014, 4:16 PM     LOS: 3 days    Records and images were personally reviewed where available.   Diagnosis: Lumbar wound abscess and Osteomyelltis  Culture Result: viridans strep  No Known Allergies  Discharge antibiotics: Ceftriaxone 2g IVPB q24h Duration: 42 days End Date: November 18, 2014.   [facility] Care Per Protocol: Labs weekly while on IV antibiotics: _X_ CBC with differential _X_ CMP _X_ CRP _X_ ESR   Fax weekly labs to [phone]  Clinic  Follow Up Appt: 6 weeks hatcher

## 2014-10-10 NOTE — Progress Notes (Signed)
Patient with post void residuals >893ml. In and out cath x2 over last 48 hours. Bladder scan showed >744ml. Foley catheter placed for retention. Will continue to monitor. Allen, [place] Marie , RN  10/10/2014 6:41 AM

## 2014-10-10 NOTE — Progress Notes (Signed)
Peripherally Inserted Central Catheter/Midline Placement  The IV Nurse has discussed with the patient and/or persons authorized to consent for the patient, the purpose of this procedure and the potential benefits and risks involved with this procedure.  The benefits include less needle sticks, lab draws from the catheter and patient may be discharged home with the catheter.  Risks include, but not limited to, infection, bleeding, blood clot (thrombus formation), and puncture of an artery; nerve damage and irregular heat beat.  Alternatives to this procedure were also discussed.  PICC/Midline Placement Documentation  PICC / Midline Single Lumen 10/10/14 PICC Right Basilic 41 cm 0 cm (Active)  Exposed Catheter (cm) 0 cm 10/10/2014  6:39 PM  Dressing Change Due 10/17/14 10/10/2014  6:39 PM       Ruvolis, Anderson Malta 10/10/2014, 6:40 PM

## 2014-10-10 NOTE — Progress Notes (Signed)
Patient ID: Mackenzie Charles, female   DOB: [birth year], 54 y.o.   MRN: [id number] BP 117/77 mmHg  Pulse 68  Temp(Src) 98.1 F (36.7 C) (Oral)  Resp 18  Ht  (1.676 m)  Wt 112.4 kg (247 lb 12.8 oz)  BMI 40.01 kg/m2  SpO2 98% Alert and oriented x 4 Reports wound is itching No antigravity strength in lower extremities picc placed today

## 2014-10-11 LAB — WOUND CULTURE

## 2014-10-11 MED ORDER — RIFAMPIN 300 MG PO CAPS: 300.0000 mg | ORAL_CAPSULE | Freq: Every day | ORAL | Status: DC

## 2014-10-11 NOTE — Progress Notes (Signed)
Rehab admissions - I met with patient today.  I explained that I do not have a rehab bed available today or tomorrow.  Currently inpatient rehab beds are full.  Patient is considering other rehab options.  Call me for questions.  #[phone]

## 2014-10-11 NOTE — Clinical Social Work Placement (Signed)
   CLINICAL SOCIAL WORK PLACEMENT  NOTE  Date:  10/11/2014  Patient Details  Name: Mackenzie Charles MRN: [id number] Date of Birth: 1961/01/20  Clinical Social Work is seeking post-discharge placement for this patient at the Skilled  Nursing Facility level of care (*CSW will initial, date and re-position this form in  chart as items are completed):  Yes   Patient/family provided with [facility] Clinical Social Work Department's list of facilities offering this level of care within the geographic area requested by the patient (or if unable, by the patient's family).  Yes   Patient/family informed of their freedom to choose among providers that offer the needed level of care, that participate in Medicare, Medicaid or managed care program needed by the patient, have an available bed and are willing to accept the patient.  Yes   Patient/family informed of [facility]'s ownership interest in [facility] and [facility], as well as of the fact that they are under no obligation to receive care at these facilities.  PASRR submitted to EDS on       PASRR number received on       Existing PASRR number confirmed on 10/11/14     FL2 transmitted to all facilities in geographic area requested by pt/family on 10/11/14     FL2 transmitted to all facilities within larger geographic area on 10/11/14     Patient informed that his/her managed care company has contracts with or will negotiate with certain facilities, including the following:            Patient/family informed of bed offers received.  Patient chooses bed at       Physician recommends and patient chooses bed at      Patient to be transferred to   on  .  Patient to be transferred to facility by       Patient family notified on   of transfer.  Name of family member notified:        PHYSICIAN Please sign FL2     Additional Comment:    _______________________________________________ Orson Gear,  Student-SW 10/11/2014, 1:00 PM

## 2014-10-11 NOTE — Progress Notes (Signed)
Pt is going to room 324 at [facility]. Dr. Ellsworth Lennox will be accepting the pt. Please call report [phone] ext. [phone]

## 2014-10-11 NOTE — Discharge Summary (Signed)
Physician Discharge Summary  Patient ID: Mackenzie Charles MRN: [id number] DOB/AGE: 08-24-1960 54 y.o.  Admit date: 10/06/2014 Discharge date: 10/11/2014  Admission Diagnoses: L1-2 discitis, osteomyelitis, epidural abscess, stenosis, paraparesis  Discharge Diagnoses: The same Active Problems:   Lumbar discitis   Discharged Condition: good  Hospital Course: I performed an L1 and L2 laminectomy with drainage of epidural abscess on the patient on 10/06/2014. Cultures grew strep. Infectious disease saw the patient and recommended IV Rocephin and by mouth rifampin. [name] strength was much better after surgery. Arrangements were made for her to go to rehabilitation.  Consults: Rehabilitation, infectious disease, physical therapy Significant Diagnostic Studies: Lumbar MRI Treatments: L1 and L2 laminectomy for drainage of epidural abscess Discharge Exam: Blood pressure 130/70, pulse 70, temperature 98.3 F (36.8 C), temperature source Oral, resp. rate 18, height  (1.676 m), weight 112.4 kg (247 lb 12.8 oz), SpO2 97 %. The patient is alert and pleasant. Her strength is 4 over 5 in her bilateral quadriceps, gastrocnemius, dorsiflexors  Disposition: Rehabilitation  Discharge Instructions    Call MD for:  difficulty breathing, headache or visual disturbances    Complete by:  As directed      Call MD for:  extreme fatigue    Complete by:  As directed      Call MD for:  hives    Complete by:  As directed      Call MD for:  persistant dizziness or light-headedness    Complete by:  As directed      Call MD for:  persistant nausea and vomiting    Complete by:  As directed      Call MD for:  redness, tenderness, or signs of infection (pain, swelling, redness, odor or green/yellow discharge around incision site)    Complete by:  As directed      Call MD for:  severe uncontrolled pain    Complete by:  As directed      Call MD for:  temperature >100.4    Complete by:  As  directed      Diet - low sodium heart healthy    Complete by:  As directed      Discharge instructions    Complete by:  As directed   Call [phone] for a followup appointment. Take a stool softener while you are using pain medications.     Driving Restrictions    Complete by:  As directed   Do not drive for 2 weeks.     Increase activity slowly    Complete by:  As directed      Lifting restrictions    Complete by:  As directed   Do not lift more than 5 pounds. No excessive bending or twisting.     May shower / Bathe    Complete by:  As directed   He may shower after the pain she is removed 3 days after surgery. Leave the incision alone.     Remove dressing in 48 hours    Complete by:  As directed   Your stitches are under the scan and will dissolve by themselves. The Steri-Strips will fall off after you take a few showers. Do not rub back or pick at the wound, Leave the wound alone.            Medication List    TAKE these medications        buPROPion 100 MG tablet  Commonly known as:  WELLBUTRIN  Take 100 mg  by mouth 2 (two) times daily.     cyclobenzaprine 10 MG tablet  Commonly known as:  FLEXERIL  Take 10 mg by mouth 3 (three) times daily as needed for muscle spasms.     gabapentin 100 MG capsule  Commonly known as:  NEURONTIN  Take 100 mg by mouth 3 (three) times daily.     HYDROmorphone 2 MG tablet  Commonly known as:  DILAUDID  Take by mouth every 4 (four) hours as needed for severe pain.     methadone 10 MG tablet  Commonly known as:  DOLOPHINE  Take 10 mg by mouth every 8 (eight) hours.     omeprazole 40 MG capsule  Commonly known as:  PRILOSEC  Take 40 mg by mouth daily.     promethazine 25 MG tablet  Commonly known as:  PHENERGAN  Take 25 mg by mouth every 6 (six) hours as needed for nausea or vomiting.     rifampin 300 MG capsule  Commonly known as:  RIFADIN  Take 1 capsule (300 mg total) by mouth daily.     sertraline 100 MG tablet   Commonly known as:  ZOLOFT  Take 100 mg by mouth daily.         SignedTressie Stalker D 10/11/2014, 7:50 AM

## 2014-10-11 NOTE — Progress Notes (Signed)
Physical Therapy Treatment Patient Details Name: Mackenzie Charles MRN: [id number] DOB: 1960/05/09 Today's Date: 10/11/2014    History of Present Illness Pt admitted on 10/06/14 with bilateral leg weakness; to OR on 10/07/14 for laminectomy and drainage of epidural abscess. PTA, pt independent of ADLS    PT Comments    Pt able to tolerate standing this date and transfer to chair. Pt with c/o catheter pain more so than back pain. Pt con't to require assist x2 for OOB mobility but is progressing towards all goals. Con't to recommend CIR upon d/c for maximal functional recovery. Pt remains to have limited L LE active mvmt but demo's improvement from yesterday.   Follow Up Recommendations  CIR;Supervision/Assistance - 24 hour     Equipment Recommendations   (TBD)    Recommendations for Other Services       Precautions / Restrictions Precautions Precautions: Back;Fall Precaution Booklet Issued: No Precaution Comments: discussed back precautions in effort to decrease pain Restrictions Weight Bearing Restrictions: No    Mobility  Bed Mobility Overal bed mobility: Needs Assistance Bed Mobility: Rolling;Sidelying to Sit Rolling: Min assist Sidelying to sit: Min assist;+2 for physical assistance       General bed mobility comments: assist for LE management for pain management and assist for trunk elevation. pt able to initiate and complete rolling with assist for LE management only  Transfers Overall transfer level: Needs assistance Equipment used: Rolling walker (2 wheeled) (also did 2 person lift with gait belt and bed pad) Transfers: Sit to/from Stand Sit to Stand: Mod assist;+2 physical assistance Stand pivot transfers: Mod assist;+2 physical assistance       General transfer comment: maxA at posterior hips to achieve upright posture, bilat knee hyperextension,dependence on UEs. stood well with RW however required 2 person assist with bed pad for stand pvt. minimal  foot clearance  Ambulation/Gait             General Gait Details: unable to at this time   Stairs            Wheelchair Mobility    Modified Rankin (Stroke Patients Only)       Balance Overall balance assessment: Needs assistance Sitting-balance support: Bilateral upper extremity supported Sitting balance-Leahy Scale: Poor Sitting balance - Comments: requires assist of UEs to maintain balance. pt with R lateral lean     Standing balance-Leahy Scale: Zero Standing balance comment: requires physical assist and RW                    Cognition Arousal/Alertness: Awake/alert Behavior During Therapy: WFL for tasks assessed/performed Overall Cognitive Status: Within Functional Limits for tasks assessed                      Exercises General Exercises - Lower Extremity Ankle Circles/Pumps: AROM;Both;20 reps Quad Sets: AROM;Both;10 reps Gluteal Sets: AROM;Both;10 reps Long Arc Quad: AROM;Both;10 reps;Seated    General Comments        Pertinent Vitals/Pain Pain Assessment: 0-10 Pain Score: 7  Pain Location: back and catheter Pain Intervention(s): Monitored during session    Home Living                      Prior Function            PT Goals (current goals can now be found in the care plan section) Progress towards PT goals: Progressing toward goals    Frequency  Min 3X/week  PT Plan Current plan remains appropriate;Frequency needs to be updated    Co-evaluation             End of Session Equipment Utilized During Treatment: Gait belt Activity Tolerance: Patient tolerated treatment well Patient left: in chair;with call bell/phone within reach     Time: 0855-0923 PT Time Calculation (min) (ACUTE ONLY): 28 min  Charges:  $Therapeutic Activity: 23-37 mins                    G Codes:      Marcene Brawn 10/11/2014, 10:26 AM   Lewis Shock, PT, DPT Pager #: [phone] Office #: [phone]

## 2014-10-11 NOTE — Clinical Social Work Note (Signed)
Clinical Social Work Assessment  Patient Details  Name: Concettina Leth MRN: [id number] Date of Birth: [birth year]  Date of referral:  10/11/14               Reason for consult:  Facility Placement                 Housing/Transportation Living arrangements for the past 2 months:  Single Family Home Source of Information:  Patient Patient Interpreter Needed:  None Criminal Activity/Legal Involvement Pertinent to Current Situation/Hospitalization:    Significant Relationships:  Adult Children Lives with:  Self Do you feel safe going back to the place where you live?  Yes Need for family participation in patient care:  Yes (Comment)  Care giving concerns:  Not being able to go to CIR. Patient was very emotional. She stated that she would prefer to stay here.   Social Worker assessment / plan:   BSW intern met with patient at bedside in reference to post discharge planning. BSW intern informed patient that CIR would not be able to take her today due to bed unavailability. Patient stated that she would most likely be staying with her daughter, Markus Daft who lives in [place]. BSW intern provided the patient with a SNF list which has facilities located in both [place] and [place]. Although patient currently lives in [place], she made it very clear to the [employer] intern that she does not want to be placed at [facility]. BSW intern will continue to follow patient and patient's family for continued support and facilitate patient's discharge needs once medically stable. FL-2 on chart for MD signature.   Employment status:  Disabled (Comment on whether or not currently receiving Disability) Insurance information:    PT Recommendations:  Inpatient Rehab Consult Information / Referral to community resources:  [facility]  Patient/Family's Response to care:   Patient alert and oriented x4. Patient agreeable to SNF search however,considering returning  home with daughter and home health services.Patient appreciated visit from [place] intern  Patient/Family's Understanding of and Emotional Response to Diagnosis, Current Treatment, and Prognosis:   Patient was knowledgeable of back surgery procedure. Patient was understanding in knowing that CIR would not be able to take her today due to the unavailability of beds.  Emotional Assessment Appearance:  Appears stated age Attitude/Demeanor/Rapport:  Crying (pleasant) Affect (typically observed):    Orientation:  Oriented to Self, Oriented to Place, Oriented to  Time, Oriented to Situation Alcohol / Substance use:    Psych involvement (Current and /or in the community):  No (Comment)  Discharge Needs  Concerns to be addressed:  Discharge Planning Concerns Readmission within the last 30 days:  No Current discharge risk:  Lives alone Barriers to Discharge:  No Barriers Identified   Leane Call, Student-SW 10/11/2014, 12:23 PM

## 2014-10-12 LAB — CULTURE, BLOOD (SINGLE): CULTURE: NO GROWTH

## 2014-10-12 LAB — ANAEROBIC CULTURE

## 2014-10-12 MED ORDER — WHITE PETROLATUM GEL
1.0000 "application " | Status: DC | PRN
  Filled 2014-10-12: qty 1

## 2014-10-12 MED ORDER — HEPARIN SOD (PORK) LOCK FLUSH 100 UNIT/ML IV SOLN
250.0000 [IU] | INTRAVENOUS | Status: AC | PRN
  Administered 2014-10-12: 250 [IU]

## 2014-10-12 NOTE — Progress Notes (Signed)
Patient ID: Mackenzie Charles, female   DOB: 03-16-1960, 54 y.o.   MRN: [id number] Subjective:  The patient is alert and pleasant. She looks and feels much better. [facility]  Objective: Vital signs in last 24 hours: Temp:  [98 F (36.7 C)-100.3 F (37.9 C)] 98.4 F (36.9 C) (09/21 1007) Pulse Rate:  [66-73] 73 (09/21 1007) Resp:  [17-20] 17 (09/21 1007) BP: (115-124)/(57-92) 119/92 mmHg (09/21 1007) SpO2:  [98 %-100 %] 98 % (09/21 1007)  Intake/Output from previous day:   Intake/Output this shift: Total I/O In: 240 [P.O.:240] Out: -   Physical exam the patient is alert and pleasant. She looks well. [name] strength is at least 4+ over 5 in her bowel quadriceps, gastrocnemius and right dorsiflexors. She has weakness in her left dorsiflexion/EHL at 4 minus over 5.  Lab Results: No results for input(s): WBC, HGB, HCT, PLT in the last 72 hours. BMET No results for input(s): NA, K, CL, CO2, GLUCOSE, BUN, CREATININE, CALCIUM in the last 72 hours.  Studies/Results: No results found.  Assessment/Plan: Postop day #5: The patient is doing well. She will need physical therapy and IV antibiotics as directed by Dr. Ninetta Lights. She is ready for discharge  LOS: 5 days     JENKINS,JEFFREY D 10/12/2014, 10:39 AM

## 2014-10-12 NOTE — Progress Notes (Signed)
Physical Therapy Treatment Patient Details Name: Mackenzie Charles MRN: [id number] DOB: 03/20/1960 Today's Date: 10/12/2014    History of Present Illness Pt admitted on 10/06/14 with bilateral leg weakness; to OR on 10/07/14 for laminectomy and drainage of epidural abscess. PTA, pt independent of ADLS    PT Comments    Pt is alert and oriented and willing to participate with PT.  She is improving functionally, now able to perform bed mobility with supervision and was able to tolerate standing pivot transfer from bed to chair.  Pt with deficits as indicated below and requires further PT services to ensure safety with functional mobility and to promote as much independence as possible.  PT recommending SNF as pt was declined by CIR.   Follow Up Recommendations  Supervision/Assistance - 24 hour;SNF (pt declined by CIR)     Equipment Recommendations   (TBD)    Recommendations for Other Services       Precautions / Restrictions Precautions Precautions: Back;Fall Precaution Booklet Issued: No Precaution Comments: discussed back precautions in effort to decrease pain Restrictions Weight Bearing Restrictions: No    Mobility  Bed Mobility Overal bed mobility: Needs Assistance Bed Mobility: Rolling;Sidelying to Sit Rolling: Modified independent (Device/Increase time) (increased time, bed rail) Sidelying to sit: Supervision;HOB elevated (cueing for hand placement, HOB elevated, bed rail)       General bed mobility comments: pt with improved ability to perform bed mobility without physical assist  Transfers Overall transfer level: Needs assistance Equipment used: Rolling walker (2 wheeled) Transfers: Sit to/from Stand Sit to Stand: Mod assist;+2 physical assistance Stand pivot transfers: Mod assist;+2 physical assistance       General transfer comment: heavy reliance on UE support and need for Mod assist at posterior hips to acheive upright; pt with difficulty advancing  LEs to pivot (R>L); cueing to scoot to EOB and for hand placement  Ambulation/Gait             General Gait Details: unable to at this time   Stairs            Wheelchair Mobility    Modified Rankin (Stroke Patients Only)       Balance Overall balance assessment: Needs assistance Sitting-balance support: Bilateral upper extremity supported;Feet supported Sitting balance-Leahy Scale: Poor Sitting balance - Comments: requires assist of UEs to maintain balance. pt with R lateral lean     Standing balance-Leahy Scale: Poor Standing balance comment: requires physical assistance and heavy reliance on UE support to maintain upright; cueing to bring hips forward to maintain upright                    Cognition Arousal/Alertness: Awake/alert Behavior During Therapy: WFL for tasks assessed/performed Overall Cognitive Status: Within Functional Limits for tasks assessed                      Exercises      General Comments        Pertinent Vitals/Pain Pain Assessment: 0-10 Pain Score: 8  Pain Location: back Pain Intervention(s): Monitored during session;Limited activity within patient's tolerance    Home Living                      Prior Function            PT Goals (current goals can now be found in the care plan section) Acute Rehab PT Goals Patient Stated Goal: to go home PT Goal Formulation: With patient  Time For Goal Achievement: 10/22/14 Potential to Achieve Goals: Good Progress towards PT goals: Progressing toward goals    Frequency  Min 3X/week    PT Plan Current plan remains appropriate    Co-evaluation             End of Session Equipment Utilized During Treatment: Gait belt Activity Tolerance: Patient tolerated treatment well Patient left: in chair;with call bell/phone within reach     Time: 0828-0847 PT Time Calculation (min) (ACUTE ONLY): 19 min  Charges:  $Therapeutic Activity: 8-22 mins                     G Codes:      Amanda Tocci November 08, 2014, 9:28 AM  Arnoldo Morale, SPT

## 2014-10-12 NOTE — Progress Notes (Signed)
Discharge orders received.  Discharge instructions and follow-up appointments reviewed with the patient.  VSS upon discharge.  IV removed and education complete.  All belongings sent with the patient.  Report called to Luci Bank, RN at [facility].  Transported via [facility]. Sondra Come, RN

## 2014-10-12 NOTE — Progress Notes (Signed)
Offered to turn Pt, Pt refused and stated she wanted to lay flat.

## 2014-10-12 NOTE — Clinical Social Work Placement (Signed)
   CLINICAL SOCIAL WORK PLACEMENT  NOTE  Date:  10/12/2014  Patient Details  Name: Mackenzie Charles MRN: [id number] Date of Birth: Sep 07, 1960  Clinical Social Work is seeking post-discharge placement for this patient at the Skilled  Nursing Facility level of care (*CSW will initial, date and re-position this form in  chart as items are completed):  Yes   Patient/family provided with [facility] [address] Clinical Social Work Department's list of facilities offering this level of care within the geographic area requested by the patient (or if unable, by the patient's family).  Yes   Patient/family informed of their freedom to choose among providers that offer the needed level of care, that participate in Medicare, Medicaid or managed care program needed by the patient, have an available bed and are willing to accept the patient.  Yes   Patient/family informed of [facility] ownership interest in [facility] and [facility], as well as of the fact that they are under no obligation to receive care at these facilities.  PASRR submitted to EDS on       PASRR number received on       Existing PASRR number confirmed on 10/11/14     FL2 transmitted to all facilities in geographic area requested by pt/family on 10/11/14     FL2 transmitted to all facilities within larger geographic area on 10/11/14     Patient informed that his/her managed care company has contracts with or will negotiate with certain facilities, including the following:        Yes   Patient/family informed of bed offers received.  Patient chooses bed at  [facility])     Physician recommends and patient chooses bed at      Patient to be transferred to  [facility]) on 10/12/14.  Patient to be transferred to facility by  Sh[facility] Mons )     Patient family notified on 10/12/14 of transfer.  Name of family member notified:   (Pt's dtr, Lane Hacker)     PHYSICIAN Please sign FL2      Additional Comment:    _______________________________________________ [name], MSW, LCSWA [phone] 10/12/2014 12:20 PM

## 2014-10-12 NOTE — Clinical Social Work Note (Signed)
Clinical Social Worker facilitated patient discharge including contacting patient family and facility to confirm patient discharge plans.  Clinical information faxed to facility and family agreeable with plan.  CSW arranged ambulance transport via [facility] to [facility].  RN to call report prior to discharge.  DC packet prepared and on chart for transport with number for report.   Clinical Social Worker will sign off for now as social work intervention is no longer needed. Please consult [place] again if new need arises.  [name], MSW, LCSWA [phone] 10/12/2014 12:21 PM

## 2014-10-13 ENCOUNTER — Encounter: Payer: Self-pay | Admitting: Internal Medicine

## 2014-10-13 ENCOUNTER — Non-Acute Institutional Stay (SKILLED_NURSING_FACILITY): Payer: Medicare Other | Admitting: Internal Medicine

## 2014-10-13 DIAGNOSIS — M4646 Discitis, unspecified, lumbar region: Secondary | ICD-10-CM

## 2014-10-13 DIAGNOSIS — F329 Major depressive disorder, single episode, unspecified: Secondary | ICD-10-CM

## 2014-10-13 DIAGNOSIS — E876 Hypokalemia: Secondary | ICD-10-CM | POA: Diagnosis not present

## 2014-10-13 DIAGNOSIS — G8929 Other chronic pain: Secondary | ICD-10-CM | POA: Diagnosis not present

## 2014-10-13 DIAGNOSIS — M549 Dorsalgia, unspecified: Secondary | ICD-10-CM

## 2014-10-13 DIAGNOSIS — F32A Depression, unspecified: Secondary | ICD-10-CM | POA: Insufficient documentation

## 2014-10-13 NOTE — Progress Notes (Signed)
Patient ID: Mackenzie Charles, female   DOB: October 14, 1960, 54 y.o.   MRN: [id number]   This is an acute visit.  Level care skilled.  Facility [facility].  Chief complaint acute visit status post hospitalization for laminectomy with history of epidural abscess.  History of present illness.  Patient is a pleasant 54 year old female with a long history of back problems-status post L4-L5 fusion by Dr. Danielle Dess and 2006.  Apparently she has had numerous back surgeries since then it appears at [facility] in [place].  Apparently her pain worsened apparently there was some fall involved here as well.  At one point she became numb and unable to the move her legs and was [facility] incessantly transferred to [facility] for further workup area  Lumbar MRI showed extensive surgery from L2-L5-elevated dense of L1-2 discitis epidural abscess possible herniated desk along with severe spinal stenosis at L1 and L2 she subsequently underwent an L1 and L2 laminectomy with drainage of her epidural abscess on 10/06/2014-cultures grew strep-infectious disease saw her and recommended IV Rocephin and by mouth rifampin which he remains on--her strength has improved since the surgery she is here for rehabilitation.  Patient has developed some mild bleeding from the surgical site-her surgeon was contacted and there is recommendation to treat this conservatively with dressing and monitor wound care will be following this closely.  Currently she has no acute complaints she appeared be anxious when I initially saw her but this appears to be much better on reevaluation.  Her vital signs are stable.  Previous medical history.  They are discitis with epidural abscess.  Chronic back pain.  Depression.  Past surgical history.  History of cervical fusion in numerous back surgeries as noted above.  Social history-patient is a current every day  smoker  Medications.   Wellbutrin 100 mg twice a day.  Flexeril 10 mg 3 times a day when necessary for muscle spasms.  Neurontin 100 mg 3 times a day.  Dilaudid 2 mg every 4 hours when necessary severe pain.  Methadone 10 mg every 8 hours.  Prilosec 40 mg daily.  Phenergan 25 mg every 6 hours when necessary nausea or vomiting.  Rifampin 300 mg daily.  Zoloft 100 mg daily  . Review of systems.  In general does not complain of fever or chills currently.  Initially anxious but this appears to be improved as well.  Skin does not complain of rashes or itching surgical site again did have a small amount of bleeding as noted above.  Head ears eyes nose mouth and throat does not complain of visual changes or sore throat.  Respiratory no complaints of cough or shortness of breath.  Cardiac no chest pain.  GI does not complain of nausea vomiting diarrhea constipation at this point or abdominal pain.  GU is not complaining of dysuria.  Musculoskeletal has extensive history of pain especially back pain as noted above current medications appear to be fairly effective.  Neurologic does not complain currently of numbness or headache or dizziness.  Psych does have a history of depression also some transitory anxiety on initial exam which appears to be resolved.  Physical exam.  She is afebrile pulse 72 respirations 18 blood pressure 124/79.  In general this is a pleasant middle-aged female in no distress initially when I saw her she was somewhat anxious but this appears to be improved on reevaluation.  Her skin is warm and dry surgical site low  back there is a small amount of dried blood I do not see significant active bleeding at this time she does have a few small vesicles blisters around surgical site.  Eyes pupils appear reactive light sclera and conjunctiva are clear visual acuity appears grossly intact.  Oropharynx clear mucous membranes moist.  Her chest is clear to  auscultation there is no labored breathing.  Heart is regular rate and rhythm without murmur gallop or rub she does not appear to have increased lower extremity edema.  Abdomen is obese soft nontender positive bowel sounds.  Muscle skeletal somewhat limited exam since she is in bed but is able to move all extremities 4 grip strength appears to be strong bilaterally no deformities noted again surgical site as noted above.  Neurologic is grossly intact her speech is clear to touch sensation appears to be intact all 4 extremities.  Psych she is alert and oriented pleasant and appropriate.  Labs.  6/16 2016.  Sedimentation rate was 87.  10/13/2014.  Sodium 139 potassium 3.7 BUN of 5 creatinine 0.6.  She did have a urine culture done apparently during hospitalization which was sensitive to Rocephin.  10/06/2014.  WBC low 0.4 hemoglobin 11.2 platelets 178.  I do note at one point her potassium was 2.8 apparently this was aggressively supplemented in the hospital for most recent lab has normalized.  Assessment plan.  History of L1-L2 discitis osteomyelitis epidural abscess-she is status post L1 and L2 laminectomy with a drainage of epidural abscess she tolerated this well culture did grow out strep she is on Rocephin and rifampin per ID recommendation at this point appears stable-as noted above her surgeon has been contacted about the small amount of bleeding at surgical site wound care will be following this this appears to be stable.  In regards to chronic back pain with numerous surgeries as noted above she is on extensive pain medication including Dilaudid every 4 hours when necessary methadone every 8 hours routine-is also on Flexeril when necessary.  She also continues on Neurontin 3 times a day 100 mg at this point appears to be relatively stable.  #2 history of mild leukocytosis on most recent lab will update this to see where her white count light she is afebrile does not  show signs of overt infection.  #3 history of depression apparently this fairly significant she is on Wellbutrin 100 mg twice a day as well as Zoloft 100 mg a day appears to be in good spirits initially appeared to be anxious but this appears to have subsided.  #4-GI prophylaxis she continues on Prilosec this appears to be stable as well.  Again will update a CBC as well as metabolic panel I do note she had hypokalemia in the hospital would like to make sure this is stabilized as well.  [id number] note greater than 40 minutes spent assessing patient-reassessing patient-reviewing her chart-discussing her status with nursing staff-and coordinating and formulating a plan of care for numerous diagnoses-of note greater than 50% of time spent coordinating plan of care

## 2014-10-17 ENCOUNTER — Encounter: Payer: Self-pay | Admitting: Internal Medicine

## 2014-10-17 ENCOUNTER — Non-Acute Institutional Stay (SKILLED_NURSING_FACILITY): Payer: Medicare Other | Admitting: Internal Medicine

## 2014-10-17 DIAGNOSIS — G8929 Other chronic pain: Secondary | ICD-10-CM

## 2014-10-17 DIAGNOSIS — M4646 Discitis, unspecified, lumbar region: Secondary | ICD-10-CM | POA: Diagnosis not present

## 2014-10-17 DIAGNOSIS — M549 Dorsalgia, unspecified: Secondary | ICD-10-CM

## 2014-10-17 NOTE — Progress Notes (Signed)
Patient ID: Mackenzie Charles, female   DOB: [birth year], 55 y.o.   MRN: [id number]     This is an acute visit.  Level care skilled.  Facility [facility].  Chief complaint acute visit secondary to pain issues.  History of present illness.  Patient is a pleasant 54 year old female with a long history of back problems-status post L4-L5 fusion by Dr. Danielle Dess and 2006.  Apparently she has had numerous back surgeries since then it appears at [facility] in [place].  Apparently her pain worsened apparently there was some fall involved here as well.  At one point she became numb and unable to the move her legs and was brought to [facility] incessantly transferred to [facility] for further workup area  Lumbar MRI showed extensive surgery from L2-L5-elevated dense of L1-2 discitis epidural abscess possible herniated desk along with severe spinal stenosis at L1 and L2 she subsequently underwent an L1 and L2 laminectomy with drainage of her epidural abscess on 10/06/2014-cultures grew strep-infectious disease saw her and recommended IV Rocephin and by mouth rifampin which he remains on--her strength has improved since the surgery she is here for rehabilitation.  Patient hds developed some mild bleeding from the surgical site-her surgeon was contacted and there is recommendation to treat this conservatively with dressing and monitor wound care will be following this closely-and apparently this has not been an issue the last couple days.   She is on extensive pain medication including methadone 10 mg every 8 hours-Dilaudid 2 mg every 4 hours when necessary which he takes frequently-she is also on Neurontin 100 mg 3 times a day as well as Flexeril when necessary.  Patient did have therapy today and is complaining of some increased pain in her buttocks area she would like her pain medications increased.    Her vital signs are stable.  Previous  medical history.  Lumbar discitis with epidural abscess.  Chronic back pain.  Depression.  Past surgical history.  History of cervical fusion in numerous back surgeries as noted above.  Social history-patient is a current every day smoker  Medications.   Wellbutrin 100 mg twice a day.  Flexeril 10 mg 3 times a day when necessary for muscle spasms.  Neurontin 100 mg 3 times a day.  Dilaudid 2 mg every 4 hours when necessary severe pain.  Methadone 10 mg every 8 hours.  Prilosec 40 mg daily.  Phenergan 25 mg every 6 hours when necessary nausea or vomiting.  Rifampin 300 mg daily.  Zoloft 100 mg daily  . Review of systems.  In general does not complain of fever or chills currently.  Initially anxious but this appears to be improved as well.  Skin does not complain of rashes or itching surgical site again did have a small amount of bleeding but does not complain of that today.  Head ears eyes nose mouth and throat does not complain of visual changes or sore throat.  Respiratory no complaints of cough or shortness of breath.  Cardiac no chest pain.  GI does not complain of nausea vomiting diarrhea constipation at this point or abdominal pain.  GU is not complaining of dysuria.  Musculoskeletal has extensive history of pain especially back pain as noted above is complaining some increased pain more so in her buttocks area after therapy.  Neurologic does not complain currently of numbness or headache or dizziness.  Psych does have a history of depression also some transitory anxiety on initial  exam which appears to be resolved.  Physical exam.  Temperature 97.4 pulse 86 respirations 70 blood pressure 133/82 O2 saturation 96% on room air.  In general this is a pleasant middle-aged female in no distress--a bit anxious   Her skin is warm and dry surgical site low back  covered with dressing apparently bleeding has resolved.  Eyes pupils appear reactive light  sclera and conjunctiva are clear visual acuity appears grossly intact.  Oropharynx clear mucous membranes moist.  Her chest is clear to auscultation there is no labored breathing.  Heart is regular rate and rhythm without murmur gallop or rub she does not appear to have increased lower extremity edema.  Abdomen is obese soft nontender positive bowel sounds.  Muscle skeletal somewhat limited exam since she is in bed but is able to move all extremities 4 There is some tenderness to palpation of her buttocks area she states this is worse after therapy as far as the back area this appears relatively baseline there is some tenderness to palpation but this does not appear to be significantly increased from baseline.  Neurologic is grossly intact her speech is clear to touch sensation appears to be intact all 4 extremities.  Psych she is alert and oriented pleasant and appropriate.  Labs.  10/13/2014.  Sodium 139 potassium 3.7 BUN 5 creatinine 1.16  6/16 2016.  Sedimentation rate was 87.  10/13/2014.  Sodium 139 potassium 3.7 BUN of 5 creatinine 0.6.  She did have a urine culture done apparently during hospitalization which was sensitive to Rocephin.  10/06/2014.  WBC low 0.4 hemoglobin 11.2 platelets 178.  I do note at one point her potassium was 2.8 apparently this was aggressively supplemented in the hospital for most recent lab has normalized.  Assessment plan.  History of L1-L2 discitis osteomyelitis epidural abscess-she is status post L1 and L2 laminectomy with a drainage of epidural abscess she tolerated this well culture did grow out strep she is on Rocephin and rifampin per ID recommendation at this point appears stable Wound care is following surgical site apparently this is stable.  In regards to chronic back pain with numerous surgeries as noted above she is on extensive pain medication including Dilaudid every 4 hours when necessary methadone every 8 hours  routine-is also on Flexeril when necessary.  She also continues on Neurontin 3 times a day 100 mg --I did discuss this with Dr. Lyn Hollingshead via phone-will increase her Neurontin to 300 mg 3 times a day-at this point hesitant increase her Dilaudid or methadone secondary to her already being on extensive pain medication.   #2-some history of mild leukocytosis in the past -- did order a CBC but do not see those results will reorder this-she has been afebrile appears to be stable -- do not see any evidence of sepsis at this point.  [id number]

## 2014-10-18 ENCOUNTER — Non-Acute Institutional Stay (SKILLED_NURSING_FACILITY): Payer: Medicare Other | Admitting: Internal Medicine

## 2014-10-18 ENCOUNTER — Encounter: Payer: Self-pay | Admitting: Internal Medicine

## 2014-10-18 DIAGNOSIS — G061 Intraspinal abscess and granuloma: Secondary | ICD-10-CM | POA: Diagnosis not present

## 2014-10-18 DIAGNOSIS — M4646 Discitis, unspecified, lumbar region: Secondary | ICD-10-CM

## 2014-10-18 DIAGNOSIS — G629 Polyneuropathy, unspecified: Secondary | ICD-10-CM | POA: Insufficient documentation

## 2014-10-18 DIAGNOSIS — G8929 Other chronic pain: Secondary | ICD-10-CM | POA: Diagnosis not present

## 2014-10-18 DIAGNOSIS — K219 Gastro-esophageal reflux disease without esophagitis: Secondary | ICD-10-CM

## 2014-10-18 DIAGNOSIS — F329 Major depressive disorder, single episode, unspecified: Secondary | ICD-10-CM | POA: Diagnosis not present

## 2014-10-18 DIAGNOSIS — F32A Depression, unspecified: Secondary | ICD-10-CM

## 2014-10-18 NOTE — Progress Notes (Signed)
MRN: [id number] Name: Mackenzie Charles  Sex: female Age: 54 y.o. DOB: 1960-12-09  [facility] Facility/Room:506 Level Of Care: SNF Provider: Merrilee Seashore D Emergency Contacts: Extended Emergency Contact Information Primary Emergency Contact: [facility] Address: [address]          [place],  [address] Home Phone: [phone] Relation: None Secondary Emergency Contact: Edgren,Harley  [place] Mobile Phone: [phone] Relation: Daughter  Code Status:   Allergies: Review of patient's allergies indicates no known allergies.  Chief Complaint  Patient presents with  . New Admit To SNF    HPI: Patient is 54 y.o. female with depression and chronic pain, followed at a pain clinic in [place], with hx of L4-5 2006 followed by 6 back surgeries at [facility]. Last 02-2013. Over 2 weeks prior to adm, she has had worsening back pain after a fall, to the point she developed numbness and weakness in her LE who was admitted to the hospital from 9/15-20 for tx of L1-2 discitis,osreomyelitis, epidural abscess, stenosis and parapesis and was taken to OR 9/15 for drainage of epidural abscess and L1L2 laminectomy. Cultures grew out strep viridans and ID rec Rocephin for 42 days. Pt is admitted to SNF for her abx course and for post surgery rehab. While at SNF she will be treated for depression with wellbutrin, GERD tx with prilosec and neuropathy tx with neurontin.  Past Medical History  Diagnosis Date  . Depression   . Chronic back pain   . GERD (gastroesophageal reflux disease)   . Chronic pain     Past Surgical History  Procedure Laterality Date  . Cervical fusion    . Lumbar laminectomy/decompression microdiscectomy  10/07/2014    Procedure: LUMBAR LAMINECTOMY  Lumbar one - two;  Surgeon: Tressie Stalker, MD;  Location: [facility];  Service: Neurosurgery;;      Medication List       This list is accurate as of: 10/18/14 11:59 PM.  Always use  your most recent med list.               buPROPion 100 MG tablet  Commonly known as:  WELLBUTRIN  Take 100 mg by mouth 2 (two) times daily.     cyclobenzaprine 10 MG tablet  Commonly known as:  FLEXERIL  Take 10 mg by mouth 3 (three) times daily as needed for muscle spasms.     gabapentin 100 MG capsule  Commonly known as:  NEURONTIN  Take 300 mg by mouth 3 (three) times daily.     HYDROmorphone 2 MG tablet  Commonly known as:  DILAUDID  Take by mouth every 4 (four) hours as needed for severe pain.     methadone 10 MG tablet  Commonly known as:  DOLOPHINE  Take 10 mg by mouth every 8 (eight) hours.     omeprazole 40 MG capsule  Commonly known as:  PRILOSEC  Take 40 mg by mouth daily.     promethazine 25 MG tablet  Commonly known as:  PHENERGAN  Take 25 mg by mouth every 6 (six) hours as needed for nausea or vomiting.     rifampin 300 MG capsule  Commonly known as:  RIFADIN  Take 1 capsule (300 mg total) by mouth daily.     sertraline 100 MG tablet  Commonly known as:  ZOLOFT  Take 100 mg by mouth daily.        No orders of the defined types were placed in this encounter.  There is no immunization history on file for this patient.  Social History  Substance Use Topics  . Smoking status: Current Every Day Smoker -- 0.30 packs/day    Types: Cigarettes  . Smokeless tobacco: Not on file  . Alcohol Use: Not on file    Family history is + addiction   Review of Systems  DATA OBTAINED: from patient, nurse GENERAL:  no fevers, fatigue, appetite changes SKIN: No itching, rash or wounds EYES: No eye pain, redness, discharge EARS: No earache, tinnitus, change in hearing NOSE: No congestion, drainage or bleeding  MOUTH/THROAT: No mouth or tooth pain, No sore throat RESPIRATORY: No cough, wheezing, SOB CARDIAC: No chest pain, palpitations, lower extremity edema  GI: No abdominal pain, No N/V/D or constipation, No heartburn or reflux  GU: No dysuria,  frequency or urgency, or incontinence  MUSCULOSKELETAL: pt is asking for more pain medication NEUROLOGIC: No headache, dizziness or focal weakness PSYCHIATRIC: No c/o anxiety or sadness   Filed Vitals:   10/22/14 2131  BP: 127/82  Pulse: 74  Temp: 96.9 F (36.1 C)  Resp: 18    SpO2 Readings from Last 1 Encounters:  10/12/14 100%        Physical Exam  GENERAL APPEARANCE: Alert, conversant, pt does not appear in pain and has at times appeared "high" SKIN: No diaphoresis rash HEAD: Normocephalic, atraumatic  EYES: Conjunctiva/lids clear. Pupils round, reactive. EOMs intact.  EARS: External exam WNL, canals clear. Hearing grossly normal.  NOSE: No deformity or discharge.  MOUTH/THROAT: Lips w/o lesions  RESPIRATORY: Breathing is even, unlabored. Lung sounds are clear   CARDIOVASCULAR: Heart RRR no murmurs, rubs or gallops. No peripheral edema.   GASTROINTESTINAL: Abdomen is soft, non-tender, not distended w/ normal bowel sounds. GENITOURINARY: Bladder non tender, not distended  MUSCULOSKELETAL: No abnormal joints or musculature NEUROLOGIC:  Cranial nerves 2-12 grossly intact. Moves all extremities  PSYCHIATRIC: Mood and effect is off, no behavioral issues  Patient Active Problem List   Diagnosis Date Noted  . Spinal epidural abscess 10/22/2014  . Chronic pain   . Neuropathy (HCC) 10/18/2014  . GERD (gastroesophageal reflux disease)   . Depression 10/13/2014  . Chronic back pain 10/13/2014  . Lumbar discitis 10/07/2014    CBC    Component Value Date/Time   WBC 11.4* 10/06/2014 2250   RBC 4.54 10/06/2014 2250   HGB 11.2* 10/06/2014 2250   HCT 34.2* 10/06/2014 2250   PLT 178 10/06/2014 2250   MCV 75.3* 10/06/2014 2250   LYMPHSABS 1.4 10/06/2014 2250   MONOABS 0.7 10/06/2014 2250   EOSABS 0.0 10/06/2014 2250   BASOSABS 0.0 10/06/2014 2250    CMP     Component Value Date/Time   NA 136 10/06/2014 2250   K 2.8* 10/06/2014 2250   CL 101 10/06/2014 2250   CO2  25 10/06/2014 2250   GLUCOSE 109* 10/06/2014 2250   BUN 9 10/06/2014 2250   CREATININE 0.52 10/06/2014 2250   CALCIUM 8.7* 10/06/2014 2250   GFRNONAA >60 10/06/2014 2250   GFRAA >60 10/06/2014 2250    No results found for: HGBA1C   Mr Lumbar Spine W Wo Contrast  10/07/2014   CLINICAL DATA:  Moderate chronic back pain, with worsening lower extremity weakness over 1 day. Worsening leg pain today.  EXAM: MRI LUMBAR SPINE WITHOUT AND WITH CONTRAST  TECHNIQUE: Multiplanar and multiecho pulse sequences of the lumbar spine were obtained without and with intravenous contrast. Per technologist note, patient had received maximum medication.  CONTRAST:  20mL MULTIHANCE GADOBENATE DIMEGLUMINE 529 MG/ML IV SOLN  COMPARISON:  CT myelogram April 13, 2004  FINDINGS: Moderate to severely motion degraded examination, further degraded by posterior instrumentation from L2 through S1. Low T1, bright STIR signal within the L1-2 disc, and edema within the L1 vertebral body. Abnormal L1 enhancement. In addition, increasing L5-S1 anterolisthesis, interval posterior instrumentation with enhancement about the interbody fusion material. Solid L4-5 interbody fusion. Moderate to severe L3-4 disc height loss, progressed.  5.2 x 1.2 x 1.2 cm (cc by AP by transverse) ventral epidural fluid collection at L1 through superior endplate of L3, with peripheral enhancement consistent with epidural abscess. Abscess results in severe canal stenosis at L1-2, nearly completely effacing the spinal canal. Abnormal T2 bright signal and enhancement within the bilateral mesial iliopsoas muscles, sub cm focal fluid collections. Severe paraspinal muscle atrophy.  IMPRESSION: Moderate to severely motion degraded examination.  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 Ventral epidural abscess L1 through L3 resulting in severe canal stenosis at L1-2. Bilateral iliopsoas myositis and, subcentimeter intramuscular abscesses bilaterally.  Status post L2 through S1 PLIF.   Grade 1 L5-S1 anterolisthesis, with enhancement about the interbody disc material consistent with motion/hardware failure. No convincing evidence of infection.  Acute findings discussed with and reconfirmed by Dr.YELVERTON on 10/07/2014 at 2:13 am.   Electronically Signed   By: Awilda Metro M.D.   On: 10/07/2014 02:15    Not all labs, radiology exams or other studies done during hospitalization come through on my EPIC note; however they are reviewed by me.    Assessment and Plan  Spinal epidural abscess L1-2, drained in surgery and grew out strep viridans; ID rec 42 days of rocephin IV  Lumbar discitis L1-2 related to epidural abscess; s/p laminectomy; SNF - for OT/PT ;IV abx for 42 days   Chronic pain SNF - I have strong suspicion for drug abuse in the past, maybe currently; I called pt's pain clinic, Dr Pandora Leiter in [facility] to get pt's pain regimen. I spoke to his PA. They had  her on methadone 20 mg TID and dilaudid 4 mg TID prn, they had managed to get her from methadone 80 mg to 60 mg  In the spring; her monthly drug screens are OK;they felt that sending pt home with a PICC was a bad idea. Pt sees them every 2 months and gets 2 months of Rx's which means for the entire time pt has been in the hospital and SNF and getting meds from [place] she has meds at home she is not using. Pt will not be getting an RX from me on d/c for 2 reasons. #1 it violates her pain contract  #2 she should have plenty of medications at home; I plan to get pt back to 60 mg methadone because that is what she was on when she came to hospital , pt was on 12 mg dilaudid a day before she came to hospital and that is what she is on now  Depression SNF - cont zoloft and wellbutrin  GERD (gastroesophageal reflux disease) SNF - cont omepraxole 40 mg  Neuropathy SNF - should be improved after surgery ;cont neurontin 300 mg TID   Time spent > 45 min;> 50% of time with patient was spent reviewing records, labs, tests and  studies, counseling and developing plan of care  Margit Hanks, MD

## 2014-10-22 ENCOUNTER — Encounter: Payer: Self-pay | Admitting: Internal Medicine

## 2014-10-22 DIAGNOSIS — G061 Intraspinal abscess and granuloma: Secondary | ICD-10-CM | POA: Insufficient documentation

## 2014-10-22 DIAGNOSIS — G8929 Other chronic pain: Secondary | ICD-10-CM | POA: Insufficient documentation

## 2014-10-23 NOTE — Assessment & Plan Note (Addendum)
SNF - pt keeps asking for more pain medication.  I have strong suspicion for drug abuse in the past, maybe currently; I called pt's pain clinic, Dr Pandora Leiter in [facility] to get pt's pain regimen. I spoke to his PA. They had  her on methadone 20 mg TID and dilaudid 4 mg TID prn, they had managed to get her from methadone 80 mg to 60 mg  In the spring; her monthly drug screens are OK;they felt that sending pt home with a PICC was a bad idea. Pt sees them every 2 months and gets 2 months of Rx's which means for the entire time pt has been in the hospital and SNF and getting meds from [place] she has meds at home she is not using. Pt will not be getting an RX from me on d/c for 2 reasons. #1 it violates her pain contract  #2 she should have plenty of medications at home; I plan to get pt back to 60 mg methadone because that is what she was on when she came to hospital , pt was on 12 mg dilaudid a day before she came to hospital and that is what she is on now

## 2014-10-23 NOTE — Assessment & Plan Note (Addendum)
SNF - should be improved after surgery ;cont neurontin 300 mg TID

## 2014-10-23 NOTE — Assessment & Plan Note (Signed)
SNF - cont omepraxole 40 mg

## 2014-10-23 NOTE — Assessment & Plan Note (Signed)
SNF - cont zoloft and wellbutrin

## 2014-10-23 NOTE — Assessment & Plan Note (Signed)
L1-2 related to epidural abscess; s/p laminectomy; SNF - for OT/PT ;IV abx for 42 days

## 2014-10-23 NOTE — Assessment & Plan Note (Signed)
L1-2, drained in surgery and grew out strep viridans; ID rec 42 days of rocephin IV

## 2014-10-28 ENCOUNTER — Encounter: Payer: Self-pay | Admitting: Internal Medicine

## 2014-11-02 ENCOUNTER — Non-Acute Institutional Stay (SKILLED_NURSING_FACILITY): Payer: Medicare Other | Admitting: Internal Medicine

## 2014-11-02 ENCOUNTER — Telehealth: Payer: Self-pay | Admitting: Internal Medicine

## 2014-11-02 ENCOUNTER — Encounter: Payer: Self-pay | Admitting: Internal Medicine

## 2014-11-02 DIAGNOSIS — G8929 Other chronic pain: Secondary | ICD-10-CM

## 2014-11-02 DIAGNOSIS — F329 Major depressive disorder, single episode, unspecified: Secondary | ICD-10-CM | POA: Diagnosis not present

## 2014-11-02 DIAGNOSIS — M4646 Discitis, unspecified, lumbar region: Secondary | ICD-10-CM | POA: Diagnosis not present

## 2014-11-02 DIAGNOSIS — F32A Depression, unspecified: Secondary | ICD-10-CM

## 2014-11-02 DIAGNOSIS — G061 Intraspinal abscess and granuloma: Secondary | ICD-10-CM

## 2014-11-02 DIAGNOSIS — K219 Gastro-esophageal reflux disease without esophagitis: Secondary | ICD-10-CM | POA: Diagnosis not present

## 2014-11-02 NOTE — Telephone Encounter (Signed)
Called by nursing home nurse that patient has been using her picc line for recreational purposes this past week. Concern for redness near line picc line. Patient is afebrile but mostly wanting to leave SNF and go home, despite knowing that her IV abtx is to be for 42 days. Currently on day 25 of 42. Discussed alternative plan would be to give last dose of ceftriaxone 2gm Iv tomorrow to get 26 days of treatment.  - switch to oral regimen of keflex 500mg  QID plus rifampin 300mg  BID x 30 days - will have her follow up with dr. Ninetta LightsHatcher in 2-4 wk

## 2014-11-02 NOTE — Progress Notes (Signed)
MRN: [id number] Name: Mackenzie Charles  Sex: female Age: 54 y.o. DOB: May 02, 1960  [facility] Facility/Room:506 Level Of Care: SNF Provider: Merrilee SeashoreALEXANDER, Lylia Karn D Emergency Contacts: Extended Emergency Contact Information Primary Emergency Contact: [facility] Address: [address]          [place],  [id number] Home Phone: [phone] Relation: None Secondary Emergency Contact: Holck,Harley  [place] Mobile Phone: [phone] Relation: Daughter  Code Status:   Allergies: Review of patient's allergies indicates no known allergies.  Chief Complaint  Patient presents with  . Discharge Note    HPI: Patient is 54 y.o. female who has chronic pain s/p multiple back surgeries who was admitted with discitis and epidural abscess, taken to surgery and on antibiotics for a long time.Pt was admitted to SNF for IV abx tx and generalized weakness. Pt came in drug seeking and family has been spending all their days and nights sleeping in her room. Pt has been leaving facility and when she returns she appears under the influence. ID feels now that it is safe for pt to be d/c on oral antibiotics. Discharging pt with a PICC line is not an option.  Past Medical History  Diagnosis Date  . Depression   . Chronic back pain   . GERD (gastroesophageal reflux disease)   . Chronic pain     Past Surgical History  Procedure Laterality Date  . Cervical fusion    . Lumbar laminectomy/decompression microdiscectomy  10/07/2014    Procedure: LUMBAR LAMINECTOMY  Lumbar one - two;  Surgeon: Tressie StalkerJeffrey Jenkins, MD;  Location: [facility];  Service: Neurosurgery;;      Medication List       This list is accurate as of: 11/02/14  3:47 PM.  Always use your most recent med list.               buPROPion 100 MG tablet  Commonly known as:  WELLBUTRIN  Take 100 mg by mouth 2 (two) times daily.     cyclobenzaprine 10 MG tablet  Commonly known as:  FLEXERIL  Take 10 mg  by mouth 3 (three) times daily as needed for muscle spasms.     gabapentin 100 MG capsule  Commonly known as:  NEURONTIN  Take 300 mg by mouth 3 (three) times daily.     HYDROmorphone 2 MG tablet  Commonly known as:  DILAUDID  Take by mouth every 4 (four) hours as needed for severe pain.     methadone 10 MG tablet  Commonly known as:  DOLOPHINE  Take 10 mg by mouth every 8 (eight) hours.     omeprazole 40 MG capsule  Commonly known as:  PRILOSEC  Take 40 mg by mouth daily.     promethazine 25 MG tablet  Commonly known as:  PHENERGAN  Take 25 mg by mouth every 6 (six) hours as needed for nausea or vomiting.     rifampin 300 MG capsule  Commonly known as:  RIFADIN  Take 1 capsule (300 mg total) by mouth daily.     sertraline 100 MG tablet  Commonly known as:  ZOLOFT  Take 100 mg by mouth daily.        No orders of the defined types were placed in this encounter.     There is no immunization history on file for this patient.  Social History  Substance Use Topics  . Smoking status: Current Every Day Smoker -- 0.30 packs/day    Types: Cigarettes  .  Smokeless tobacco: Not on file  . Alcohol Use: Not on file    Filed Vitals:   11/02/14 1544  BP: 142/78  Pulse: 76  Temp: 97 F (36.1 C)  Resp: 18    Physical Exam  GENERAL APPEARANCE: Alert, conversant. No acute distress.  HEENT: Unremarkable. RESPIRATORY: Breathing is even, unlabored. Lung sounds are clear   CARDIOVASCULAR: Heart RRR no murmurs, rubs or gallops. No peripheral edema.  GASTROINTESTINAL: Abdomen is soft, non-tender, not distended w/ normal bowel sounds.  NEUROLOGIC: Cranial nerves 2-12 grossly intact. Moves all extremities  Patient Active Problem List   Diagnosis Date Noted  . Spinal epidural abscess 10/22/2014  . Chronic pain   . Neuropathy (HCC) 10/18/2014  . GERD (gastroesophageal reflux disease)   . Depression 10/13/2014  . Chronic back pain 10/13/2014  . Lumbar discitis 10/07/2014     CBC    Component Value Date/Time   WBC 11.4* 10/06/2014 2250   RBC 4.54 10/06/2014 2250   HGB 11.2* 10/06/2014 2250   HCT 34.2* 10/06/2014 2250   PLT 178 10/06/2014 2250   MCV 75.3* 10/06/2014 2250   LYMPHSABS 1.4 10/06/2014 2250   MONOABS 0.7 10/06/2014 2250   EOSABS 0.0 10/06/2014 2250   BASOSABS 0.0 10/06/2014 2250    CMP     Component Value Date/Time   NA 136 10/06/2014 2250   K 2.8* 10/06/2014 2250   CL 101 10/06/2014 2250   CO2 25 10/06/2014 2250   GLUCOSE 109* 10/06/2014 2250   BUN 9 10/06/2014 2250   CREATININE 0.52 10/06/2014 2250   CALCIUM 8.7* 10/06/2014 2250   GFRNONAA >60 10/06/2014 2250   GFRAA >60 10/06/2014 2250    Assessment and Plan  Pt is d/c to home with HH/OT/PT. She is d/c on 2 oral antibiotics, Keflex 500 mg q6 and rifampin 300 mg BID, which will be continued until ID says it is safe to stop. She is to f/u with Dr Lovell Sheehan, N"surg and Dr Ninetta Lights ID. She will not d/c with prescriptions for pain medications- to do so would break her pain contract with her doctor in [facility] and she should have plenty left from her 60 days of medications from the pain clinic from the 25 days she has been in hospital and SNF.  Margit Hanks, MD

## 2014-11-03 ENCOUNTER — Non-Acute Institutional Stay (SKILLED_NURSING_FACILITY): Payer: Medicare Other | Admitting: Internal Medicine

## 2014-11-03 ENCOUNTER — Inpatient Hospital Stay (HOSPITAL_COMMUNITY)
Admission: EM | Admit: 2014-11-03 | Discharge: 2014-11-08 | DRG: 094 | Disposition: A | Discharge status: Skilled Nursing Facility | Payer: Medicare Other | Attending: Internal Medicine | Admitting: Internal Medicine

## 2014-11-03 ENCOUNTER — Encounter (HOSPITAL_COMMUNITY): Payer: Self-pay | Admitting: Emergency Medicine

## 2014-11-03 ENCOUNTER — Emergency Department (HOSPITAL_COMMUNITY): Payer: Medicare Other

## 2014-11-03 DIAGNOSIS — M609 Myositis, unspecified: Secondary | ICD-10-CM | POA: Diagnosis present

## 2014-11-03 DIAGNOSIS — G8929 Other chronic pain: Secondary | ICD-10-CM | POA: Diagnosis present

## 2014-11-03 DIAGNOSIS — M4317 Spondylolisthesis, lumbosacral region: Secondary | ICD-10-CM | POA: Diagnosis present

## 2014-11-03 DIAGNOSIS — Z8619 Personal history of other infectious and parasitic diseases: Secondary | ICD-10-CM | POA: Insufficient documentation

## 2014-11-03 DIAGNOSIS — K219 Gastro-esophageal reflux disease without esophagitis: Secondary | ICD-10-CM | POA: Diagnosis present

## 2014-11-03 DIAGNOSIS — G061 Intraspinal abscess and granuloma: Secondary | ICD-10-CM | POA: Diagnosis not present

## 2014-11-03 DIAGNOSIS — M549 Dorsalgia, unspecified: Secondary | ICD-10-CM | POA: Diagnosis not present

## 2014-11-03 DIAGNOSIS — M5410 Radiculopathy, site unspecified: Secondary | ICD-10-CM | POA: Insufficient documentation

## 2014-11-03 DIAGNOSIS — F32A Depression, unspecified: Secondary | ICD-10-CM | POA: Diagnosis present

## 2014-11-03 DIAGNOSIS — M869 Osteomyelitis, unspecified: Secondary | ICD-10-CM | POA: Diagnosis present

## 2014-11-03 DIAGNOSIS — G629 Polyneuropathy, unspecified: Secondary | ICD-10-CM | POA: Diagnosis present

## 2014-11-03 DIAGNOSIS — M4646 Discitis, unspecified, lumbar region: Secondary | ICD-10-CM

## 2014-11-03 DIAGNOSIS — F17210 Nicotine dependence, cigarettes, uncomplicated: Secondary | ICD-10-CM | POA: Diagnosis present

## 2014-11-03 DIAGNOSIS — G062 Extradural and subdural abscess, unspecified: Secondary | ICD-10-CM | POA: Diagnosis not present

## 2014-11-03 DIAGNOSIS — M4806 Spinal stenosis, lumbar region: Secondary | ICD-10-CM | POA: Diagnosis present

## 2014-11-03 DIAGNOSIS — M4620 Osteomyelitis of vertebra, site unspecified: Secondary | ICD-10-CM | POA: Diagnosis present

## 2014-11-03 DIAGNOSIS — F329 Major depressive disorder, single episode, unspecified: Secondary | ICD-10-CM | POA: Diagnosis present

## 2014-11-03 DIAGNOSIS — M25552 Pain in left hip: Secondary | ICD-10-CM

## 2014-11-03 DIAGNOSIS — F1990 Other psychoactive substance use, unspecified, uncomplicated: Secondary | ICD-10-CM | POA: Insufficient documentation

## 2014-11-03 DIAGNOSIS — K6812 Psoas muscle abscess: Secondary | ICD-10-CM | POA: Diagnosis present

## 2014-11-03 LAB — CBC WITH DIFFERENTIAL/PLATELET
BASOS PCT: 0 %
Basophils Absolute: 0 10*3/uL (ref 0.0–0.1)
Eosinophils Absolute: 0.2 10*3/uL (ref 0.0–0.7)
Eosinophils Relative: 4 %
HEMATOCRIT: 33.5 % — AB (ref 36.0–46.0)
HEMOGLOBIN: 10.9 g/dL — AB (ref 12.0–15.0)
LYMPHS ABS: 1.1 10*3/uL (ref 0.7–4.0)
Lymphocytes Relative: 19 %
MCH: 24.8 pg — ABNORMAL LOW (ref 26.0–34.0)
MCHC: 32.5 g/dL (ref 30.0–36.0)
MCV: 76.1 fL — ABNORMAL LOW (ref 78.0–100.0)
MONOS PCT: 9 %
Monocytes Absolute: 0.5 10*3/uL (ref 0.1–1.0)
NEUTROS ABS: 3.8 10*3/uL (ref 1.7–7.7)
NEUTROS PCT: 68 %
Platelets: 144 10*3/uL — ABNORMAL LOW (ref 150–400)
RBC: 4.4 MIL/uL (ref 3.87–5.11)
RDW: 14.7 % (ref 11.5–15.5)
WBC: 5.6 10*3/uL (ref 4.0–10.5)

## 2014-11-03 LAB — BASIC METABOLIC PANEL
ANION GAP: 12 (ref 5–15)
BUN: 7 mg/dL (ref 6–20)
CHLORIDE: 99 mmol/L — AB (ref 101–111)
CO2: 25 mmol/L (ref 22–32)
CREATININE: 0.63 mg/dL (ref 0.44–1.00)
Calcium: 9.3 mg/dL (ref 8.9–10.3)
GFR calc non Af Amer: >60 mL/min (ref >60)
Glucose, Bld: 127 mg/dL — ABNORMAL HIGH (ref 65–99)
Potassium: 3.9 mmol/L (ref 3.5–5.1)
Sodium: 136 mmol/L (ref 135–145)

## 2014-11-03 MED ORDER — HYDROMORPHONE HCL 1 MG/ML IJ SOLN
0.5000 mg | Freq: Once | INTRAMUSCULAR | Status: AC
  Administered 2014-11-03: 0.5 mg via INTRAVENOUS
  Filled 2014-11-03: qty 1

## 2014-11-03 MED ORDER — METHOCARBAMOL 1000 MG/10ML IJ SOLN
1000.0000 mg | Freq: Once | INTRAMUSCULAR | Status: DC
Start: 2014-11-03 — End: 2014-11-03
  Filled 2014-11-03: qty 10

## 2014-11-03 MED ORDER — METHOCARBAMOL 1000 MG/10ML IJ SOLN
1000.0000 mg | Freq: Once | INTRAVENOUS | Status: AC
  Administered 2014-11-04: 1000 mg via INTRAVENOUS
  Filled 2014-11-03: qty 10

## 2014-11-03 MED ORDER — METHOCARBAMOL 1000 MG/10ML IJ SOLN
1000.0000 mg | Freq: Once | INTRAMUSCULAR | Status: DC
  Filled 2014-11-03: qty 10

## 2014-11-03 MED ORDER — LORAZEPAM 2 MG/ML IJ SOLN
0.5000 mg | Freq: Once | INTRAMUSCULAR | Status: AC
  Administered 2014-11-03: 0.5 mg via INTRAVENOUS
  Filled 2014-11-03: qty 1

## 2014-11-03 MED ORDER — LORAZEPAM 2 MG/ML IJ SOLN
1.0000 mg | Freq: Once | INTRAMUSCULAR | Status: AC
  Administered 2014-11-03: 1 mg via INTRAVENOUS
  Filled 2014-11-03: qty 1

## 2014-11-03 MED ORDER — HYDROMORPHONE HCL 1 MG/ML IJ SOLN
1.0000 mg | Freq: Once | INTRAMUSCULAR | Status: AC
  Administered 2014-11-03: 1 mg via INTRAVENOUS
  Filled 2014-11-03: qty 1

## 2014-11-03 NOTE — ED Provider Notes (Signed)
The patient is on chronic pain medications including methadone, she has had recent epidural abscess which required neurosurgical drainage, had follow-up with neurosurgeon today and had no pain, states that after that she developed acute onset of severe pain and at this time complains of ongoing pain in the lower back. She also complains of some leg pain. She has been on antibiotics, on exam she appears to have good pain control, she is not writhing, she is able to move all 4 extremities, she has no fever.  MRI pending to rule out recurrent or ongoing infection or abscess, suspect element of chronic pain, pain medications and Ativan have been given to help with MRI as the patient has been refusing to lay flat on her back for that study. After encouragement she is willing to try.  The pt will need to be admitted if MRI shows pathology requiring intervention or antibiotics.  Medical screening examination/treatment/procedure(s) were conducted as a shared visit with non-physician practitioner(s) and myself.  I personally evaluated the patient during the encounter.  Clinical Impression:   Final diagnoses:  Radicular pain of left lower extremity  Epidural abscess  Discitis of lumbar region         Eber HongBrian Chelsey Kimberley, MD 11/06/14 2005

## 2014-11-03 NOTE — Progress Notes (Signed)
Patient ID: Mackenzie Charles, female   DOB: 24-Jan-1960, 54 y.o.   MRN: [id number] MRN: [id number] Name: Mackenzie Charles  Sex: female Age: 54 y.o. DOB: 24-Jan-1960  [facility] Facility[facility] Level Of Care: SNF Provider: Roena MaladyLASSEN, Kodee Ravert C Emergency Contacts: Extended Emergency Contact Information Primary Emergency Contact: [facility]CAROL Address: [address]          [place],  [id number] Home Phone: [phone] Relation: None Secondary Emergency Contact: Cake,Harley  [place] Mobile Phone: [phone] Relation: Daughter  Code Status:   Allergies: Review of patient's allergies indicates no known allergies.  Chief Complaint  Patient presents with  . Acute Visit   secondary complaints of left sided acute hip upper leg pain  HPI: Patient is 54 y.o. female who has chronic pain s/p multiple back surgeries who was admitted with discitis and epidural abscess, taken to surgery and on antibiotics for a long time.Pt was admitted to SNF for IV abx tx and generalized weakness. . ID feels patient had reached a point where it iwas safe for pt to be d/c on oral antibiotics. Which consist of rifampin and Keflex.  However this evening patient is complaining of an acuteonset of intense left hip and upper leg pain.  She cannot really give a  definative precipitating factor which brought on this acute pain although apparently she did make a movement that she thinks may have precipitated it . She is on extensive pain medication including methadone 10 mg every 8 hours-Dilaudid 2 mg every 4 hours when necessary Neurontin 300 mg 3 times a day and Flexeril 10 mg 3 times a day Past Medical History  Diagnosis Date  . Depression   . Chronic back pain   . GERD (gastroesophageal reflux disease)   . Chronic pain     Past Surgical History  Procedure Laterality Date  . Cervical fusion    . Lumbar laminectomy/decompression microdiscectomy  10/07/2014   Procedure: LUMBAR LAMINECTOMY  Lumbar one - two;  Surgeon: Tressie StalkerJeffrey Jenkins, MD;  Location: [facility];  Service: Neurosurgery;;    Medications have been reviewed and include.  Wellbutrin 100 mg twice a day.  Flexeril 10 mg 3 times a day.  Neurontin 300 mg 3 times a day.  The lot at 2 mg every 4 hours when necessary.  Methadone 10 mg every 8 hours.  Prilosec 40 mg daily.  Phenergan 25 mg every 6 hours when necessary.  Rifampin 300 mg daily.  Zoloft 100 mg daily.  She is also on Keflex   Review of systems is limited secondary to patient appearing to be in acute pain. Quite anxious  In general is not complaining of any chest pain fever or chills or shortness of breath.  However she is complaining of intense left hip upper leg pain--does not appear she is really complaining of numbness     Social History  Substance Use Topics  . Smoking status: Current Every Day Smoker -- 0.30 packs/day    Types: Cigarettes  . Smokeless tobacco: Not on file  . Alcohol Use: Not on file    Filed Vitals:   11/13/14 1213  BP: 151/72  Pulse: 93  Temp: 97.2 F (36.2 C)  Resp: 24    Physical Exam Temperature 97.2 pulse 93 respirations 24 blood pressure 151/72 GENERAL APPEARANCE: Alert, conversant.  very anxious-distressed about her left hip and leg pain Her skin is warm-slightly diaphoretic.  Eyes pupils appear to be reactive to light visual acuity intact.  HEENT: Unremarkable. RESPIRATORY: Breathing is even, unlabored. Lung sounds are clear   CARDIOVASCULAR: Heart RRR no murmurs, rubs or gallops. No peripheral edema.  GASTROINTESTINAL: Abdomen is soft, non-tender, not distended w/ normal bowel sounds Musculoskeletal is able to move all extremities there is tenderness palpation of the left hip and upper leg area I do not note any deformity here-she does have touch sensation--she is tossing and turning complaining of acute pain  NEUROLOGIC: Cranial nerves 2-12 grossly intact.  Moves all extremities Psych she is very anxious is alert and oriented  Patient Active Problem List   Diagnosis Date Noted  . Epidural abscess   . IVDU (intravenous drug user)   . History of hepatitis C   . Discitis of lumbar region 11/04/2014  . Spinal abscess (HCC) 11/04/2014  . Radicular pain of left lower extremity   . Spinal epidural abscess 10/22/2014  . Chronic pain   . Neuropathy (HCC) 10/18/2014  . GERD (gastroesophageal reflux disease)   . Depression 10/13/2014  . Chronic back pain 10/13/2014  . Lumbar discitis 10/07/2014    CBC    Component Value Date/Time   WBC 4.5 11/04/2014 0600   RBC 4.49 11/04/2014 0600   HGB 10.9* 11/04/2014 0600   HCT 34.3* 11/04/2014 0600   PLT 141* 11/04/2014 0600   MCV 76.4* 11/04/2014 0600   LYMPHSABS 1.1 11/03/2014 2102   MONOABS 0.5 11/03/2014 2102   EOSABS 0.2 11/03/2014 2102   BASOSABS 0.0 11/03/2014 2102    CMP     Component Value Date/Time   NA 135 11/04/2014 0600   K 4.1 11/04/2014 0600   CL 99* 11/04/2014 0600   CO2 27 11/04/2014 0600   GLUCOSE 103* 11/04/2014 0600   BUN 5* 11/04/2014 0600   CREATININE 0.55 11/04/2014 0600   CALCIUM 9.3 11/04/2014 0600   GFRNONAA >60 11/04/2014 0600   GFRAA >60 11/04/2014 0600    Assessment and Plan   . #1 acute pain left hip and leg with history of epidural abscess discitis-she was slated for discharge tomorrow-however she is complaining of acute changes this evening acute pain which is making her very anxious-will send her to the ER for emergent evaluation.  I did reassess her awaiting EMSs arrival she remained stable although still tossing and turning very anxious complaining of acute pain and this was again localized to her left hip and leg area.  [id number] Nicholad Kautzman C,

## 2014-11-03 NOTE — ED Notes (Signed)
Patient arrived via [facility] from [facility] for rehab. EMS reports: patient began having severe L leg pain which increases with movement. Patient also has constant numbness in L leg. Patient has had history of back surgeries, chronic back pain. Denies fall or trauma. BP 118/62, Pulse 88, 94% on room air. Patient has PICC line in right upper arm that she reports she has been receiving IV antibiotic therapy since being at rehab.

## 2014-11-03 NOTE — ED Notes (Signed)
Patient transported to MRI 

## 2014-11-03 NOTE — ED Provider Notes (Signed)
CSN: [id number]     Arrival date & time 11/03/14  2018 History   First MD Initiated Contact with Patient 11/03/14 2031     Chief Complaint  Patient presents with  . Leg Pain     (Consider location/radiation/quality/duration/timing/severity/associated sxs/prior Treatment) Patient is a 54 y.o. female presenting with leg pain. The history is provided by the patient. No language interpreter was used.  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 She is currently receiving antibiotics via PICC line at the nursing home. She presents tonight with sudden onset sharp pain into the left hip and leg with certain movement and/or position. No fall or new injury. Last seen by Dr. Tressie StalkerJeffrey Jenkins, her neurosurgeon, today in routine follow up, during which she was not having symptoms. Symptoms associated with prior pathology included low back pain with bilateral lower extremity pain and paresthesia. She currently reports pain only, and pain limited to the left side. No fever, abdominal pain, urinary symptoms, change in bowel habits.    Past Medical History  Diagnosis Date  . Depression   . Chronic back pain   . GERD (gastroesophageal reflux disease)   . Chronic pain    Past Surgical History  Procedure Laterality Date  . Cervical  fusion    . Lumbar laminectomy/decompression microdiscectomy  10/07/2014    Procedure: LUMBAR LAMINECTOMY  Lumbar one - two;  Surgeon: Tressie StalkerJeffrey Jenkins, MD;  Location: [facility];  Service: Neurosurgery;;   Family History  Problem Relation Age of Onset  . Drug abuse Other    Social History  Substance Use Topics  . Smoking status: Current Every Day Smoker -- 0.30 packs/day    Types: Cigarettes  . Smokeless tobacco: None  . Alcohol Use: None   OB History    No data available     Review of Systems  Constitutional: Negative for fever and chills.  Respiratory: Negative.  Negative for shortness of breath.   Cardiovascular: Negative.  Negative for chest pain.  Gastrointestinal: Negative.  Negative for nausea, vomiting and abdominal pain.  Musculoskeletal: Positive for back pain.       See HPI.  Skin: Negative.  Negative for color change and wound.  Neurological: Negative.  Negative for weakness and numbness.      Allergies  Review of patient's allergies indicates no known allergies.  Home Medications   Prior to Admission medications   Medication Sig Start Date End Date Taking? Authorizing Provider  buPROPion (WELLBUTRIN) 100 MG tablet Take 100 mg by mouth 2 (two) times daily.    Historical Provider, MD  clotrimazole (LOTRIMIN) 1 % cream Apply 1 application topically 2 (two) times daily.    Historical Provider, MD  cyclobenzaprine (FLEXERIL) 10 MG tablet Take 10 mg by mouth 3 (three) times daily as needed for muscle spasms.    Historical Provider,  MD  gabapentin (NEURONTIN) 100 MG capsule Take 300 mg by mouth 3 (three) times daily.     Historical Provider, MD  omeprazole (PRILOSEC) 40 MG capsule Take 40 mg by mouth daily.    Historical Provider, MD  promethazine (PHENERGAN) 25 MG tablet Take 25 mg by mouth every 6 (six) hours as needed for nausea or vomiting.    Historical Provider, MD  rifampin (RIFADIN) 300 MG capsule Take 1 capsule (300 mg total) by mouth daily. Patient  taking differently: Take 300 mg by mouth 2 (two) times daily.  10/11/14   Tressie Stalker, MD  sertraline (ZOLOFT) 100 MG tablet Take 100 mg by mouth daily.    Historical Provider, MD   BP 135/55 mmHg  Pulse 90  Temp(Src) 98.1 F (36.7 C) (Oral)  Resp 18  Ht  (1.702 m)  Wt 230 lb (104.327 kg)  BMI 36.01 kg/m2  SpO2 99% Physical Exam  Constitutional: She is oriented to person, place, and time. She appears well-developed and well-nourished.  Neck: Normal range of motion.  Cardiovascular: Intact distal pulses.   Pulmonary/Chest: Effort normal.  Abdominal: There is no tenderness.  Musculoskeletal:  FROM left lower extremity with severe, sudden onset pain into left hip and posterior/anterior thigh when prone. No swelling of the extremity. Low back without swelling, redness or warmth. Healed midline surgical scar unremarkable.   Neurological: She is alert and oriented to person, place, and time. Coordination normal.  Baseline sensory exam LE's bilaterally.  Skin: Skin is warm and dry.    ED Course  Procedures (including critical care time) Labs Review Labs Reviewed  CBC WITH DIFFERENTIAL/PLATELET  BASIC METABOLIC PANEL    Imaging Review No results found. I have personally reviewed and evaluated these images and lab results as part of my medical decision-making.   EKG Interpretation None      MDM   Final diagnoses:  Radicular pain of left lower extremity    1. Spinal epidural abscess, recurrent  Pain has been managed in the emergency department but pain increases when lying on her back. Appropriate medications provided for comfort to obtain MRI study. Dr. Hyacinth Meeker has seen and evaluated the patient. MRI obtained successfully.  Results MRI show recurrent of epidural abscess and psoas abscesses. Discussed with neurosurgery who reports repeat surgical decompression is not felt to be strong option. Requests admit to medicine with his consultation and follow. Discussed with  Dr. Maryfrances Bunnell who accepts for admission.    Elpidio Anis, PA-C 11/04/14 2123  Eber Hong, MD 11/06/14 2004

## 2014-11-04 ENCOUNTER — Inpatient Hospital Stay (HOSPITAL_COMMUNITY): Payer: Medicare Other

## 2014-11-04 ENCOUNTER — Encounter (HOSPITAL_COMMUNITY): Payer: Self-pay | Admitting: Family Medicine

## 2014-11-04 DIAGNOSIS — M4317 Spondylolisthesis, lumbosacral region: Secondary | ICD-10-CM | POA: Diagnosis present

## 2014-11-04 DIAGNOSIS — M5410 Radiculopathy, site unspecified: Secondary | ICD-10-CM

## 2014-11-04 DIAGNOSIS — F17210 Nicotine dependence, cigarettes, uncomplicated: Secondary | ICD-10-CM | POA: Diagnosis present

## 2014-11-04 DIAGNOSIS — B9689 Other specified bacterial agents as the cause of diseases classified elsewhere: Secondary | ICD-10-CM | POA: Diagnosis not present

## 2014-11-04 DIAGNOSIS — F1990 Other psychoactive substance use, unspecified, uncomplicated: Secondary | ICD-10-CM | POA: Diagnosis not present

## 2014-11-04 DIAGNOSIS — G8929 Other chronic pain: Secondary | ICD-10-CM | POA: Diagnosis present

## 2014-11-04 DIAGNOSIS — K219 Gastro-esophageal reflux disease without esophagitis: Secondary | ICD-10-CM | POA: Diagnosis present

## 2014-11-04 DIAGNOSIS — M4620 Osteomyelitis of vertebra, site unspecified: Secondary | ICD-10-CM | POA: Diagnosis present

## 2014-11-04 DIAGNOSIS — G062 Extradural and subdural abscess, unspecified: Secondary | ICD-10-CM | POA: Diagnosis not present

## 2014-11-04 DIAGNOSIS — M549 Dorsalgia, unspecified: Secondary | ICD-10-CM | POA: Diagnosis not present

## 2014-11-04 DIAGNOSIS — Z8619 Personal history of other infectious and parasitic diseases: Secondary | ICD-10-CM | POA: Diagnosis not present

## 2014-11-04 DIAGNOSIS — G629 Polyneuropathy, unspecified: Secondary | ICD-10-CM

## 2014-11-04 DIAGNOSIS — F329 Major depressive disorder, single episode, unspecified: Secondary | ICD-10-CM | POA: Diagnosis present

## 2014-11-04 DIAGNOSIS — M869 Osteomyelitis, unspecified: Secondary | ICD-10-CM | POA: Diagnosis present

## 2014-11-04 DIAGNOSIS — M4646 Discitis, unspecified, lumbar region: Secondary | ICD-10-CM | POA: Diagnosis present

## 2014-11-04 DIAGNOSIS — M4806 Spinal stenosis, lumbar region: Secondary | ICD-10-CM | POA: Diagnosis present

## 2014-11-04 DIAGNOSIS — K6812 Psoas muscle abscess: Secondary | ICD-10-CM | POA: Diagnosis present

## 2014-11-04 DIAGNOSIS — G061 Intraspinal abscess and granuloma: Secondary | ICD-10-CM | POA: Diagnosis present

## 2014-11-04 DIAGNOSIS — M609 Myositis, unspecified: Secondary | ICD-10-CM | POA: Diagnosis present

## 2014-11-04 LAB — MRSA PCR SCREENING: MRSA by PCR: NEGATIVE

## 2014-11-04 LAB — BASIC METABOLIC PANEL
Anion gap: 9 (ref 5–15)
BUN: 5 mg/dL — AB (ref 6–20)
CALCIUM: 9.3 mg/dL (ref 8.9–10.3)
CHLORIDE: 99 mmol/L — AB (ref 101–111)
CO2: 27 mmol/L (ref 22–32)
CREATININE: 0.55 mg/dL (ref 0.44–1.00)
GFR calc Af Amer: >60 mL/min (ref >60)
GFR calc non Af Amer: >60 mL/min (ref >60)
GLUCOSE: 103 mg/dL — AB (ref 65–99)
Potassium: 4.1 mmol/L (ref 3.5–5.1)
Sodium: 135 mmol/L (ref 135–145)

## 2014-11-04 LAB — CBC
HCT: 34.3 % — ABNORMAL LOW (ref 36.0–46.0)
Hemoglobin: 10.9 g/dL — ABNORMAL LOW (ref 12.0–15.0)
MCH: 24.3 pg — AB (ref 26.0–34.0)
MCHC: 31.8 g/dL (ref 30.0–36.0)
MCV: 76.4 fL — AB (ref 78.0–100.0)
PLATELETS: 141 10*3/uL — AB (ref 150–400)
RBC: 4.49 MIL/uL (ref 3.87–5.11)
RDW: 14.9 % (ref 11.5–15.5)
WBC: 4.5 10*3/uL (ref 4.0–10.5)

## 2014-11-04 LAB — APTT: APTT: 33 s (ref 24–37)

## 2014-11-04 LAB — PROTIME-INR
INR: 1.19 (ref 0.00–1.49)
Prothrombin Time: 15.3 seconds — ABNORMAL HIGH (ref 11.6–15.2)

## 2014-11-04 MED ORDER — CYCLOBENZAPRINE HCL 10 MG PO TABS
10.0000 mg | ORAL_TABLET | Freq: Three times a day (TID) | ORAL | Status: DC | PRN
  Administered 2014-11-05 – 2014-11-08 (×8): 10 mg via ORAL
  Filled 2014-11-04 (×9): qty 1

## 2014-11-04 MED ORDER — GABAPENTIN 400 MG PO CAPS
500.0000 mg | ORAL_CAPSULE | Freq: Three times a day (TID) | ORAL | Status: DC
Start: 2014-11-04 — End: 2014-11-06
  Administered 2014-11-04 – 2014-11-05 (×6): 500 mg via ORAL
  Filled 2014-11-04 (×12): qty 1

## 2014-11-04 MED ORDER — HYDROMORPHONE HCL 1 MG/ML IJ SOLN
1.0000 mg | INTRAMUSCULAR | Status: DC | PRN
  Administered 2014-11-04: 1 mg via INTRAVENOUS
  Filled 2014-11-04: qty 1

## 2014-11-04 MED ORDER — MIDAZOLAM HCL 2 MG/2ML IJ SOLN
INTRAMUSCULAR | Status: AC | PRN
  Administered 2014-11-04: 2 mg via INTRAVENOUS
  Administered 2014-11-04: 1 mg via INTRAVENOUS

## 2014-11-04 MED ORDER — ONDANSETRON HCL 4 MG/2ML IJ SOLN
4.0000 mg | Freq: Four times a day (QID) | INTRAMUSCULAR | Status: DC | PRN
  Administered 2014-11-05 – 2014-11-07 (×3): 4 mg via INTRAVENOUS
  Filled 2014-11-04 (×3): qty 2

## 2014-11-04 MED ORDER — LIDOCAINE HCL 1 % IJ SOLN
INTRAMUSCULAR | Status: AC
  Filled 2014-11-04: qty 20

## 2014-11-04 MED ORDER — METHADONE HCL 10 MG PO TABS
10.0000 mg | ORAL_TABLET | Freq: Every day | ORAL | Status: DC
  Administered 2014-11-04 – 2014-11-08 (×5): 10 mg via ORAL
  Filled 2014-11-04 (×7): qty 1

## 2014-11-04 MED ORDER — FENTANYL CITRATE (PF) 100 MCG/2ML IJ SOLN
INTRAMUSCULAR | Status: AC
  Filled 2014-11-04: qty 4

## 2014-11-04 MED ORDER — VANCOMYCIN HCL 10 G IV SOLR
1500.0000 mg | Freq: Once | INTRAVENOUS | Status: AC
  Administered 2014-11-04: 1500 mg via INTRAVENOUS
  Filled 2014-11-04: qty 1500

## 2014-11-04 MED ORDER — SODIUM CHLORIDE 0.9 % IJ SOLN
10.0000 mL | INTRAMUSCULAR | Status: DC | PRN
  Administered 2014-11-05 – 2014-11-07 (×2): 10 mL
  Filled 2014-11-04 (×2): qty 40

## 2014-11-04 MED ORDER — PIPERACILLIN-TAZOBACTAM 3.375 G IVPB
3.3750 g | Freq: Three times a day (TID) | INTRAVENOUS | Status: DC
  Administered 2014-11-04: 3.375 g via INTRAVENOUS
  Filled 2014-11-04 (×3): qty 50

## 2014-11-04 MED ORDER — ONDANSETRON HCL 4 MG PO TABS: 4.0000 mg | ORAL_TABLET | Freq: Four times a day (QID) | ORAL | Status: DC | PRN

## 2014-11-04 MED ORDER — PANTOPRAZOLE SODIUM 40 MG PO TBEC
40.0000 mg | DELAYED_RELEASE_TABLET | Freq: Every day | ORAL | Status: DC
  Administered 2014-11-04 – 2014-11-08 (×5): 40 mg via ORAL
  Filled 2014-11-04 (×5): qty 1

## 2014-11-04 MED ORDER — FENTANYL CITRATE (PF) 100 MCG/2ML IJ SOLN
INTRAMUSCULAR | Status: AC | PRN
  Administered 2014-11-04: 50 ug via INTRAVENOUS

## 2014-11-04 MED ORDER — VANCOMYCIN HCL IN DEXTROSE 1-5 GM/200ML-% IV SOLN
1000.0000 mg | Freq: Three times a day (TID) | INTRAVENOUS | Status: DC
  Administered 2014-11-04 – 2014-11-05 (×3): 1000 mg via INTRAVENOUS
  Filled 2014-11-04 (×6): qty 200

## 2014-11-04 MED ORDER — PROMETHAZINE HCL 25 MG/ML IJ SOLN
25.0000 mg | Freq: Four times a day (QID) | INTRAMUSCULAR | Status: DC | PRN
  Administered 2014-11-04 – 2014-11-06 (×6): 25 mg via INTRAVENOUS
  Filled 2014-11-04 (×7): qty 1

## 2014-11-04 MED ORDER — DEXTROSE 5 % IV SOLN
2.0000 g | INTRAVENOUS | Status: DC
  Administered 2014-11-04 – 2014-11-08 (×5): 2 g via INTRAVENOUS
  Filled 2014-11-04 (×6): qty 2

## 2014-11-04 MED ORDER — METHADONE HCL 10 MG PO TABS
20.0000 mg | ORAL_TABLET | Freq: Two times a day (BID) | ORAL | Status: DC
  Administered 2014-11-04 – 2014-11-08 (×9): 20 mg via ORAL
  Filled 2014-11-04 (×9): qty 2

## 2014-11-04 MED ORDER — HYDROMORPHONE HCL 2 MG PO TABS
2.0000 mg | ORAL_TABLET | ORAL | Status: DC | PRN
  Administered 2014-11-04 – 2014-11-07 (×7): 2 mg via ORAL
  Filled 2014-11-04 (×7): qty 1

## 2014-11-04 MED ORDER — RIFAMPIN 300 MG PO CAPS
300.0000 mg | ORAL_CAPSULE | Freq: Every day | ORAL | Status: DC
  Administered 2014-11-04: 300 mg via ORAL
  Filled 2014-11-04: qty 1

## 2014-11-04 MED ORDER — HYDROMORPHONE HCL 1 MG/ML IJ SOLN
1.0000 mg | INTRAMUSCULAR | Status: DC | PRN
  Administered 2014-11-04 – 2014-11-06 (×14): 1 mg via INTRAVENOUS
  Filled 2014-11-04 (×14): qty 1

## 2014-11-04 MED ORDER — RIFAMPIN 300 MG PO CAPS
300.0000 mg | ORAL_CAPSULE | Freq: Two times a day (BID) | ORAL | Status: DC
  Administered 2014-11-04 – 2014-11-08 (×8): 300 mg via ORAL
  Filled 2014-11-04 (×8): qty 1

## 2014-11-04 MED ORDER — MIDAZOLAM HCL 2 MG/2ML IJ SOLN
INTRAMUSCULAR | Status: AC
  Filled 2014-11-04: qty 6

## 2014-11-04 MED ORDER — BUPROPION HCL 100 MG PO TABS
100.0000 mg | ORAL_TABLET | Freq: Two times a day (BID) | ORAL | Status: DC
  Administered 2014-11-04 – 2014-11-08 (×9): 100 mg via ORAL
  Filled 2014-11-04 (×11): qty 1

## 2014-11-04 MED ORDER — DOCUSATE SODIUM 100 MG PO CAPS
100.0000 mg | ORAL_CAPSULE | Freq: Two times a day (BID) | ORAL | Status: DC
  Administered 2014-11-04 – 2014-11-08 (×9): 100 mg via ORAL
  Filled 2014-11-04 (×9): qty 1

## 2014-11-04 MED ORDER — METHADONE HCL 10 MG PO TABS: 30.0000 mg | ORAL_TABLET | Freq: Every day | ORAL | Status: DC

## 2014-11-04 MED ORDER — SERTRALINE HCL 100 MG PO TABS
100.0000 mg | ORAL_TABLET | Freq: Every day | ORAL | Status: DC
  Administered 2014-11-04 – 2014-11-08 (×5): 100 mg via ORAL
  Filled 2014-11-04 (×5): qty 1

## 2014-11-04 MED ORDER — GADOBENATE DIMEGLUMINE 529 MG/ML IV SOLN
20.0000 mL | Freq: Once | INTRAVENOUS | Status: AC | PRN
  Administered 2014-11-04: 20 mL via INTRAVENOUS

## 2014-11-04 MED ORDER — GABAPENTIN 300 MG PO CAPS: 300.0000 mg | ORAL_CAPSULE | Freq: Three times a day (TID) | ORAL | Status: DC

## 2014-11-04 NOTE — ED Notes (Signed)
Pt tried getting up to use the bedside commode, but was unable to sit up or move left leg without pain. Pt also, unable to use bedpan because of pain. Informed RN.

## 2014-11-04 NOTE — Progress Notes (Signed)
ANTIBIOTIC CONSULT NOTE - INITIAL  Pharmacy Consult for Vancomycin and Zosyn  Indication: spinal abcess  No Known Allergies  Patient Measurements: Height: 5\' 7"  (170.2 cm) Weight: 230 lb (104.327 kg) IBW/kg (Calculated) : 61.6 Adjusted Body Weight: 80 kg  Vital Signs: Temp: 98.2 F (36.8 C) (10/14 0507) Temp Source: Oral (10/14 0507) BP: 123/65 mmHg (10/14 0507) Pulse Rate: 78 (10/14 0507) Intake/Output from previous day:   Intake/Output from this shift:    Labs:  Recent Labs  11/03/14 2102  WBC 5.6  HGB 10.9*  PLT 144*  CREATININE 0.63   Estimated Creatinine Clearance: 99.9 mL/min (by C-G formula based on Cr of 0.63). No results for input(s): VANCOTROUGH, VANCOPEAK, VANCORANDOM, GENTTROUGH, GENTPEAK, GENTRANDOM, TOBRATROUGH, TOBRAPEAK, TOBRARND, AMIKACINPEAK, AMIKACINTROU, AMIKACIN in the last 72 hours.   Microbiology: Recent Results (from the past 720 hour(s))  Urine culture     Status: None   Collection Time: 10/06/14 10:50 PM  Result Value Ref Range Status   Specimen Description URINE, CLEAN CATCH  Final   Special Requests NONE  Final   Culture   Final    >=100,000 COLONIES/mL ESCHERICHIA COLI Performed at [facility]    Report Status 10/09/2014 FINAL  Final   Organism ID, Bacteria ESCHERICHIA COLI  Final      Susceptibility   Escherichia coli - MIC*    AMPICILLIN >=32 RESISTANT Resistant     CEFAZOLIN 8 SENSITIVE Sensitive     CEFTRIAXONE <=1 SENSITIVE Sensitive     CIPROFLOXACIN <=0.25 SENSITIVE Sensitive     GENTAMICIN <=1 SENSITIVE Sensitive     IMIPENEM <=0.25 SENSITIVE Sensitive     NITROFURANTOIN <=16 SENSITIVE Sensitive     TRIMETH/SULFA >=320 RESISTANT Resistant     AMPICILLIN/SULBACTAM >=32 RESISTANT Resistant     PIP/TAZO 16 SENSITIVE Sensitive     * >=100,000 COLONIES/mL ESCHERICHIA COLI  Anaerobic culture     Status: None   Collection Time: 10/07/14  5:22 AM  Result Value Ref Range Status   Specimen Description WOUND  Final    Special Requests LUMBAR   Final   Gram Stain   Final    FEW WBC PRESENT, PREDOMINANTLY PMN NO SQUAMOUS EPITHELIAL CELLS SEEN FEW GRAM POSITIVE COCCI IN PAIRS Performed at [facility]    Culture   Final    NO ANAEROBES ISOLATED Performed at [facility]    Report Status 10/12/2014 FINAL  Final  Gram stain     Status: None   Collection Time: 10/07/14  5:22 AM  Result Value Ref Range Status   Specimen Description WOUND  Final   Special Requests LUMBAR  Final   Gram Stain   Final    RARE WBC PRESENT, PREDOMINANTLY PMN NO ORGANISMS SEEN    Report Status 10/07/2014 FINAL  Final  Wound culture     Status: None   Collection Time: 10/07/14  5:22 AM  Result Value Ref Range Status   Specimen Description WOUND  Final   Special Requests LUMBAR  Final   Gram Stain   Final    FEW WBC PRESENT, PREDOMINANTLY PMN NO SQUAMOUS EPITHELIAL CELLS SEEN MODERATE GRAM POSITIVE COCCI IN PAIRS Performed at [facility]    Culture   Final    MODERATE VIRIDANS STREPTOCOCCUS Performed at [facility]    Report Status 10/11/2014 FINAL  Final  Culture, blood (single)     Status: None   Collection Time: 10/07/14  9:55 AM  Result Value Ref Range Status  Specimen Description BLOOD LEFT ANTECUBITAL  Final   Special Requests BOTTLES DRAWN AEROBIC AND ANAEROBIC 5CC  Final   Culture NO GROWTH 5 DAYS  Final   Report Status 10/12/2014 FINAL  Final  MRSA PCR Screening     Status: Abnormal   Collection Time: 10/07/14 10:40 AM  Result Value Ref Range Status   MRSA by PCR POSITIVE (A) NEGATIVE Final    Comment:        The GeneXpert MRSA Assay (FDA approved for NASAL specimens only), is one component of a comprehensive MRSA colonization surveillance program. It is not intended to diagnose MRSA infection nor to guide or monitor treatment for MRSA infections. RESULT CALLED TO, READ BACK BY AND VERIFIED WITH: SUMMERELL,P RN @ 1158 10/07/14 LEONARD,A     Medical  History: Past Medical History  Diagnosis Date  . Depression   . Chronic back pain   . GERD (gastroesophageal reflux disease)   . Chronic pain     Medications:  Prescriptions prior to admission  Medication Sig Dispense Refill Last Dose  . buPROPion (WELLBUTRIN) 100 MG tablet Take 100 mg by mouth 2 (two) times daily.   11/03/2014 at Unknown time  . clotrimazole (LOTRIMIN) 1 % cream Apply 1 application topically 2 (two) times daily.   Past Month at Unknown time  . cyclobenzaprine (FLEXERIL) 10 MG tablet Take 10 mg by mouth 3 (three) times daily as needed for muscle spasms.   11/03/2014 at Unknown time  . gabapentin (NEURONTIN) 100 MG capsule Take 300 mg by mouth 3 (three) times daily.    11/03/2014 at Unknown time  . HYDROmorphone (DILAUDID) 4 MG tablet Take 16 mg by mouth daily. Patient states she takes 4 tablets every day for breakthrough pain per pain   11/03/2014 at Unknown time  . methadone (DOLOPHINE) 10 MG tablet Take 60 mg by mouth daily. Take 6 tablets every day per patient   11/03/2014 at Unknown time  . omeprazole (PRILOSEC) 40 MG capsule Take 40 mg by mouth daily.   11/03/2014 at Unknown time  . promethazine (PHENERGAN) 25 MG tablet Take 25 mg by mouth every 6 (six) hours as needed for nausea or vomiting.   11/03/2014 at Unknown time  . sertraline (ZOLOFT) 100 MG tablet Take 100 mg by mouth daily.   11/03/2014 at Unknown time   Assessment: 54 y.o. female with epidural abcess, discitis/myelitis, recent course of Rocephin for Strep viridans discitis, for empiric antibiotics  Goal of Therapy:  Vancomycin trough level 15-20 mcg/ml  Plan:  Vancomycin 1500 mg IV now, then 1 g IV q8h Zosyn 3.375 g IV q8h   Jedidiah Demartini, Gary Fleet 11/04/2014,5:12 AM

## 2014-11-04 NOTE — Consult Note (Signed)
Chief Complaint: Patient was seen in consultation today for back pain; Left psoas abscess Chief Complaint  Patient presents with  . Leg Pain   at the request of Dr Butler Denmark  Referring Physician(s): Dr Delma Officer  History of Present Illness: Mackenzie Charles is a 54 y.o. female   Pt with Hx 6 back surgeries all at [facility] Sees Dr Delma Officer as OP for epidural abscess post I&D approx 1 mo ago Had done well until just last night Developed severe left leg pain and back pain MRI revealed:  IMPRESSION: 1. 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 Resultant severe canal stenosis at the level of L1-2 despite interval performance of L1/L2 decompressive laminectomy. 2. 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 3. Status post L2 through S1 PLIF with interval performance of additional L1 and L2 decompressive laminectomy. Postoperative collection at the L1-2 laminectomy site as above. 4. Grade 1 anterolisthesis of L5 on S1, stable.  Request per admitting MD for aspiration of Left psoas abscess Dr Bonnielee Haff has reviewed imaging and approves procedure I have seen and examined pt  Past Medical History  Diagnosis Date  . Depression   . Chronic back pain   . GERD (gastroesophageal reflux disease)   . Chronic pain     Past Surgical History  Procedure Laterality Date  . Cervical fusion    . Lumbar laminectomy/decompression microdiscectomy  10/07/2014    Procedure: LUMBAR LAMINECTOMY  Lumbar one - two;  Surgeon: Tressie Stalker, MD;  Location: [facility];  Service: Neurosurgery;;    Allergies: Review of patient's allergies indicates no known allergies.  Medications: Prior to Admission medications   Medication Sig Start Date End Date Taking? Authorizing Provider    buPROPion (WELLBUTRIN) 100 MG tablet Take 100 mg by mouth 2 (two) times daily.   Yes Historical Provider, MD  clotrimazole (LOTRIMIN) 1 % cream Apply 1 application topically 2 (two) times daily.   Yes Historical Provider, MD  cyclobenzaprine (FLEXERIL) 10 MG tablet Take 10 mg by mouth 3 (three) times daily as needed for muscle spasms.   Yes Historical Provider, MD  gabapentin (NEURONTIN) 100 MG capsule Take 300 mg by mouth 3 (three) times daily.    Yes Historical Provider, MD  HYDROcodone-acetaminophen (NORCO/VICODIN) 5-325 MG tablet Take 1 tablet by mouth every 4 (four) hours as needed for moderate pain.   Yes Historical Provider, MD  HYDROmorphone (DILAUDID) 2 MG tablet Take 2 mg by mouth every 4 (four) hours as needed for severe pain.   Yes Historical Provider, MD  HYDROmorphone (DILAUDID) 4 MG tablet Take 16 mg by mouth daily. Patient states she takes 4 tablets every day for breakthrough pain per pain   Yes Historical Provider, MD  methadone (DOLOPHINE) 10 MG tablet Take 60 mg by mouth daily. Take 6 tablets every day per patient   Yes Historical Provider, MD  methadone (DOLOPHINE) 10 MG tablet Take 10-20 mg by mouth 3 (three) times daily. 20 mg at 8am, 10 pm at 2pm and 20 mg at 8pm   Yes Historical Provider, MD  omeprazole (PRILOSEC) 40 MG capsule Take 40 mg by mouth daily.   Yes Historical Provider, MD  promethazine (PHENERGAN) 25 MG tablet Take 25 mg by mouth every 6 (six) hours as needed for nausea or vomiting.  Yes Historical Provider, MD  sertraline (ZOLOFT) 100 MG tablet Take 100 mg by mouth daily.   Yes Historical Provider, MD     Family History  Problem Relation Age of Onset  . Drug abuse Other     Social History   Social History  . Marital Status: Legally Separated    Spouse Name: N/A  . Number of Children: N/A  . Years of Education: N/A   Social History Main Topics  . Smoking status: Current Every Day Smoker -- 0.30 packs/day    Types: Cigarettes  . Smokeless tobacco:  None  . Alcohol Use: None  . Drug Use: None  . Sexual Activity: Not Asked   Other Topics Concern  . None   Social History Narrative     Review of Systems: A 12 point ROS discussed and pertinent positives are indicated in the HPI above.  All other systems are negative.  Review of Systems  Constitutional: Positive for activity change, appetite change and fatigue. Negative for fever.  Respiratory: Negative for cough and shortness of breath.   Cardiovascular: Negative for chest pain.  Genitourinary: Negative for dysuria and difficulty urinating.  Musculoskeletal: Positive for back pain and gait problem.  Neurological: Positive for weakness.  Psychiatric/Behavioral: Negative for behavioral problems and confusion.    Vital Signs: BP 145/85 mmHg  Pulse 75  Temp(Src) 97.8 F (36.6 C) (Oral)  Resp 18  Ht 5\' 7"  (1.702 m)  Wt 230 lb (104.327 kg)  BMI 36.01 kg/m2  SpO2 97%  Physical Exam  Constitutional: She is oriented to person, place, and time. She appears well-nourished.  Cardiovascular: Normal rate, regular rhythm and normal heart sounds.   Pulmonary/Chest: Effort normal and breath sounds normal. She has no wheezes.  Abdominal: Soft. Bowel sounds are normal.  Musculoskeletal: Normal range of motion. She exhibits tenderness.  Neurological: She is alert and oriented to person, place, and time.  Skin: Skin is warm and dry.  Psychiatric: She has a normal mood and affect. Her behavior is normal. Judgment and thought content normal.  Nursing note and vitals reviewed.   Mallampati Score:  MD Evaluation Airway: WNL Heart: WNL Abdomen: WNL Chest/ Lungs: WNL ASA  Classification: 2 Mallampati/Airway Score: Two  Imaging: Mr Lumbar Spine W Wo Contrast  11/04/2014  CLINICAL DATA:  Initial evaluation for severe left leg pain, increasing with movement. Constant numbness in left leg. History of back surgery. EXAM: MRI LUMBAR SPINE WITHOUT AND WITH CONTRAST TECHNIQUE: Multiplanar  and multiecho pulse sequences of the lumbar spine were obtained without and with intravenous contrast. CONTRAST:  20mL MULTIHANCE GADOBENATE DIMEGLUMINE 529 MG/ML IV SOLN COMPARISON:  Prior MRI from 10/07/2014. FINDINGS: Study is mildly degraded by motion artifact. Patient is status post posterior instrumentation at L2 through S1. Alignment is stable with grade 1 anterolisthesis of L5 on S1. Enhancement about the interbody graft at L5-S1 again seen. Vertebral body heights are maintained. Postoperative changes from interval performance of decompressive laminectomy at the L1 and L2 levels. Postoperative collection at the laminectomy site measures 2.9 x 2.4 x 1.9 cm. No significant enhancement about this collection, favoring postoperative seroma. There is superficial extension along the incisional site with small amount of fluid along the incisional wound (series 6, image 4). There is persistent abnormal T1 hypo intense, T2/STIR hyperintense signal intensity within the L1 and L2 vertebral bodies with postcontrast enhancement, concerning for persistent osteomyelitis discitis. There is edema with enhancement within the adjacent psoas musculature, suggesting associated myositis. Irregular T2 hyperintense rim enhancing  collections now seen at the left anterior margin of the superior aspect of L2, measuring 2.6 x 1.6 cm, concerning for paraspinous abscess (series 6, image 14). Additional more confluent T2 hyperintense collection more inferiorly within the left psoas muscle of the level of L4 measures 2.3 x 1.0 cm (series 6, image 28), also suspicious for abscess. There is a 4.4 x 1.7 x 2.3 cm collection within the ventral epidural space centered at the level of L1-2, but spanning from L1 through the superior aspect of L3. Associated irregular enhancement on post-contrast sequences. Findings concerning for recurrent epidural abscess. Abnormal enhancement throughout the epidural space seen at the L1-2 level, which main in  large part be related to prior surgery. There is fairly severe canal stenosis at the level of L1-2 with the thecal sac measuring 10 mm in AP diameter at its most narrow point. Additional multilevel degenerative changes are grossly similar. Marked distension of the bladder noted. IMPRESSION: 1. 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 Resultant severe canal stenosis at the level of L1-2 despite interval performance of L1/L2 decompressive laminectomy. 2. 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 3. Status post L2 through S1 PLIF with interval performance of additional L1 and L2 decompressive laminectomy. Postoperative collection at the L1-2 laminectomy site as above. 4. Grade 1 anterolisthesis of L5 on S1, stable. Critical Value/emergent results were called by telephone at the time of interpretation on 11/04/2014 at 2:01 am to [facility] , who verbally acknowledged these results. Electronically Signed   By: Rise Mu M.D.   On: 11/04/2014 02:02   Mr Lumbar Spine W Wo Contrast  10/07/2014  CLINICAL DATA:  Moderate chronic back pain, with worsening lower extremity weakness over 1 day. Worsening leg pain today. EXAM: MRI LUMBAR SPINE WITHOUT AND WITH CONTRAST TECHNIQUE: Multiplanar and multiecho pulse sequences of the lumbar spine were obtained without and with intravenous contrast. Per technologist note, patient had received maximum medication. CONTRAST:  20mL MULTIHANCE GADOBENATE DIMEGLUMINE 529 MG/ML IV SOLN COMPARISON:  CT myelogram April 13, 2004 FINDINGS: Moderate to severely motion degraded examination, further degraded by posterior instrumentation from L2 through S1. Low T1, bright STIR signal within the L1-2 disc, and edema within the L1 vertebral body. Abnormal L1 enhancement. In addition,  increasing L5-S1 anterolisthesis, interval posterior instrumentation with enhancement about the interbody fusion material. Solid L4-5 interbody fusion. Moderate to severe L3-4 disc height loss, progressed. 5.2 x 1.2 x 1.2 cm (cc by AP by transverse) ventral epidural fluid collection at L1 through superior endplate of L3, with peripheral enhancement consistent with epidural abscess. Abscess results in severe canal stenosis at L1-2, nearly completely effacing the spinal canal. Abnormal T2 bright signal and enhancement within the bilateral mesial iliopsoas muscles, sub cm focal fluid collections. Severe paraspinal muscle atrophy. IMPRESSION: Moderate to severely motion degraded examination. 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 Ventral epidural abscess L1 through L3 resulting in severe canal stenosis at L1-2. Bilateral iliopsoas myositis and, subcentimeter intramuscular abscesses bilaterally. Status post L2 through S1 PLIF. Grade 1 L5-S1 anterolisthesis, with enhancement about the interbody disc material consistent with motion/hardware failure. No convincing evidence of infection. Acute findings discussed with and reconfirmed by Dr.YELVERTON on 10/07/2014 at 2:13 am. Electronically Signed   By: Awilda Metro M.D.   On: 10/07/2014 02:15    Labs:  CBC:  Recent Labs  10/06/14 2250 11/03/14 2102 11/04/14 0600  WBC 11.4* 5.6 4.5  HGB 11.2* 10.9* 10.9*  HCT 34.2* 33.5* 34.3*  PLT 178 144* 141*    COAGS: No results for input(s): INR, APTT in the last 8760 hours.  BMP:  Recent Labs  10/06/14 2250 11/03/14 2102 11/04/14 0600  NA 136 136 135  K 2.8* 3.9 4.1  CL 101 99* 99*  CO2 25 25 27   GLUCOSE 109* 127* 103*  BUN 9 7 5*  CALCIUM 8.7* 9.3 9.3  CREATININE 0.52 0.63 0.55  GFRNONAA >60 >60 >60  GFRAA >60 >60 >60    LIVER FUNCTION TESTS: No results for input(s): BILITOT, AST, ALT, ALKPHOS, PROT, ALBUMIN in the last 8760 hours.  TUMOR MARKERS: No results for input(s): AFPTM, CEA, CA199,  CHROMGRNA in the last 8760 hours.  Assessment and Plan:  Left leg and back pain MRI 11/04/14 reveals discitis L1-2; L psoas abscess Now scheduled for L psoas abscess aspiration Risks and Benefits discussed with the patient including, but not limited to bleeding, infection, damage to adjacent structures or low yield requiring additional tests. All of the patient's questions were answered, patient is agreeable to proceed. Consent signed and in chart.   Thank you for this interesting consult.  I greatly enjoyed meeting [place] and look forward to participating in their care.  A copy of this report was sent to the requesting provider on this date.  Signed: Adair Lauderback A 11/04/2014, 12:42 PM   I spent a total of 40 Minutes    in face to face in clinical consultation, greater than 50% of which was counseling/coordinating care for L psoas abscess aspiration

## 2014-11-04 NOTE — Sedation Documentation (Signed)
Pt states no way she is pregnant

## 2014-11-04 NOTE — Progress Notes (Signed)
Pharmacy Re: Methadone regimen prior to admission  [phone]).  Just discharged last night.  Most recent Methadone regimen:  20 mg at 8am, 10 mg at 2pm and 20 mg at pm daily.   Increased from 10 mg TID on 10/28/14.   Last dose at 2pm on 10/13 at facility.   Also had orders for Dilaudid 2 mg q4h prn and Norco 5-235 mg q4h prn. Last Norco at 4:30pm on 10/13 at facility.  Discussed with Dr. Butler Denmarkizwan.  Nicolette Bangheresa Cathlin Buchan, RPh Pager: [phone] 11/04/2014 10:34 AM

## 2014-11-04 NOTE — Consult Note (Signed)
[facility]  Total days of antibiotics 26        Day 1 vanco        Day 26 ceftriaxone        Day 26 rif (only daily dosing)       Reason for Consult: ventral epidural abscess    Referring Physician: rizwan  Principal Problem:   Spinal epidural abscess Active Problems:   Depression   Chronic back pain   GERD (gastroesophageal reflux disease)   Neuropathy (HCC)   Discitis of lumbar region   Spinal abscess (HCC)   Radicular pain of left lower extremity    HPI: Mackenzie Charles is a 54 y.o. female with depression, chronic pain and numerous back surgeries, last in Jan 2015 for fusion however, she was seen in mid September with worsening back pain, found to have lumbar discitis and epidural abscess. She underwent I x D by Dr. Arnoldo Morale. OR cx grew microaerophilic strep on 8/11. She was discharged to do 6 wks of IV ceftriaxone 2gm IV daily plus oral rifampin (unclear why only 345m daily dosing) She was in skilled facility but they had called twice last week that the patient no longer wanted to stay and go home despite only being on treatment from 3 1/2 wks. She was in the process of finishing 25 days of 42 of IV therapy to go home. She did follow up with Dr. JArnoldo Moraleyesterday but then had worsening back pain radiating down her left leg the following morning, thus wen to [facility] for evaluation. . A follow-up MRI demonstrates 2 new areas of osteomyelitis discitis and ventral epidural abscess, plus psoas m. Abscess. She was started on vancomycin and piptazo. ID consultation called to weigh in on management. She was seen by Dr. JArnoldo Moralewho wanted to medically manage rather than surgery. SNF concern that she was using picc line reacreationally while she was not at the facility  Pt denies fever, chills, nightsweats  I have reviewed mri and it appears to have new areas of involvement  Past Medical History  Diagnosis Date  . Depression   . Chronic  back pain   . GERD (gastroesophageal reflux disease)   . Chronic pain     Allergies: No Known Allergies   MEDICATIONS: . buPROPion  100 mg Oral BID  . cefTRIAXone (ROCEPHIN)  IV  2 g Intravenous Q24H  . docusate sodium  100 mg Oral BID  . fentaNYL      . gabapentin  500 mg Oral TID  . lidocaine      . methadone  10 mg Oral Daily  . methadone  20 mg Oral Q12H  . midazolam      . pantoprazole  40 mg Oral Daily  . rifampin  300 mg Oral Daily  . sertraline  100 mg Oral Daily  . vancomycin  1,000 mg Intravenous Q8H    Social History  Substance Use Topics  . Smoking status: Current Every Day Smoker -- 0.30 packs/day    Types: Cigarettes  . Smokeless tobacco: None  . Alcohol Use: None    Family History  Problem Relation Age of Onset  . Drug abuse Other     Review of Systems  Constitutional: Negative for fever, chills, diaphoresis, activity change, appetite change, fatigue and unexpected weight change.  HENT: Negative for congestion, sore throat, rhinorrhea, sneezing, trouble swallowing and sinus pressure.  Eyes: Negative for photophobia and visual disturbance.  Respiratory: Negative  for cough, chest tightness, shortness of breath, wheezing and stridor.  Cardiovascular: Negative for chest pain, palpitations and leg swelling.  Gastrointestinal: Negative for nausea, vomiting, abdominal pain, diarrhea, constipation, blood in stool, abdominal distention and anal bleeding.  Genitourinary: Negative for dysuria, hematuria, flank pain and difficulty urinating.  Musculoskeletal: severe low back pain that radiates down left leg Skin: Negative for color change, pallor, rash and wound.  Neurological: Negative for dizziness, tremors, weakness and light-headedness.  Hematological: Negative for adenopathy. Does not bruise/bleed easily.  Psychiatric/Behavioral: Negative for behavioral problems, confusion, sleep disturbance, dysphoric mood, decreased concentration and agitation.     OBJECTIVE: Temp:  [97.8 F (36.6 C)-98.2 F (36.8 C)] 97.8 F (36.6 C) (10/14 1017) Pulse Rate:  [75-92] 78 (10/14 1443) Resp:  [15-19] 18 (10/14 1443) BP: (101-145)/(47-85) 135/81 mmHg (10/14 1443) SpO2:  [93 %-100 %] 96 % (10/14 1443) Weight:  [230 lb (104.327 kg)] 230 lb (104.327 kg) (10/13 2031) Physical Exam  Constitutional:  oriented to person, place, and time. appears well-developed and well-nourished. No distress.  HENT: [place]/AT, PERRLA, no scleral icterus Mouth/Throat: Oropharynx is clear and moist. No oropharyngeal exudate.  Cardiovascular: Normal rate, regular rhythm and normal heart sounds. Exam reveals no gallop and no friction rub.  No murmur heard.  Pulmonary/Chest: Effort normal and breath sounds normal. No respiratory distress.  has no wheezes.  Neck = supple, no nuchal rigidity Abdominal: Soft. Bowel sounds are normal.  exhibits no distension. There is no tenderness.  Lymphadenopathy: no cervical adenopathy. No axillary adenopathy Neurological: alert and oriented to person, place, and time.  Skin: Skin is warm and dry. No rash noted. No erythema.  Psychiatric: a normal mood and affect.  behavior is normal.  Ext: picc line c/d/i, slight redness at insertion site  LABS: Results for orders placed or performed during the hospital encounter of 11/03/14 (from the past 48 hour(s))  CBC with Differential     Status: Abnormal   Collection Time: 11/03/14  9:02 PM  Result Value Ref Range   WBC 5.6 4.0 - 10.5 K/uL   RBC 4.40 3.87 - 5.11 MIL/uL   Hemoglobin 10.9 (L) 12.0 - 15.0 g/dL   HCT 33.5 (L) 36.0 - 46.0 %   MCV 76.1 (L) 78.0 - 100.0 fL   MCH 24.8 (L) 26.0 - 34.0 pg   MCHC 32.5 30.0 - 36.0 g/dL   RDW 14.7 11.5 - 15.5 %   Platelets 144 (L) 150 - 400 K/uL   Neutrophils Relative % 68 %   Neutro Abs 3.8 1.7 - 7.7 K/uL   Lymphocytes Relative 19 %   Lymphs Abs 1.1 0.7 - 4.0 K/uL   Monocytes Relative 9 %   Monocytes Absolute 0.5 0.1 - 1.0 K/uL   Eosinophils  Relative 4 %   Eosinophils Absolute 0.2 0.0 - 0.7 K/uL   Basophils Relative 0 %   Basophils Absolute 0.0 0.0 - 0.1 K/uL  Basic metabolic panel     Status: Abnormal   Collection Time: 11/03/14  9:02 PM  Result Value Ref Range   Sodium 136 135 - 145 mmol/L   Potassium 3.9 3.5 - 5.1 mmol/L   Chloride 99 (L) 101 - 111 mmol/L   CO2 25 22 - 32 mmol/L   Glucose, Bld 127 (H) 65 - 99 mg/dL   BUN 7 6 - 20 mg/dL   Creatinine, Ser 0.63 0.44 - 1.00 mg/dL   Calcium 9.3 8.9 - 10.3 mg/dL   GFR calc non Af Amer >60 >60 mL/min  GFR calc Af Amer >60 >60 mL/min    Comment: (NOTE) The eGFR has been calculated using the CKD EPI equation. This calculation has not been validated in all clinical situations. eGFR's persistently <60 mL/min signify possible Chronic Kidney Disease.    Anion gap 12 5 - 15  MRSA PCR Screening     Status: None   Collection Time: 11/04/14  5:11 AM  Result Value Ref Range   MRSA by PCR NEGATIVE NEGATIVE    Comment:        The GeneXpert MRSA Assay (FDA approved for NASAL specimens only), is one component of a comprehensive MRSA colonization surveillance program. It is not intended to diagnose MRSA infection nor to guide or monitor treatment for MRSA infections.   Basic metabolic panel     Status: Abnormal   Collection Time: 11/04/14  6:00 AM  Result Value Ref Range   Sodium 135 135 - 145 mmol/L   Potassium 4.1 3.5 - 5.1 mmol/L   Chloride 99 (L) 101 - 111 mmol/L   CO2 27 22 - 32 mmol/L   Glucose, Bld 103 (H) 65 - 99 mg/dL   BUN 5 (L) 6 - 20 mg/dL   Creatinine, Ser 0.55 0.44 - 1.00 mg/dL   Calcium 9.3 8.9 - 10.3 mg/dL   GFR calc non Af Amer >60 >60 mL/min   GFR calc Af Amer >60 >60 mL/min    Comment: (NOTE) The eGFR has been calculated using the CKD EPI equation. This calculation has not been validated in all clinical situations. eGFR's persistently <60 mL/min signify possible Chronic Kidney Disease.    Anion gap 9 5 - 15  CBC     Status: Abnormal    Collection Time: 11/04/14  6:00 AM  Result Value Ref Range   WBC 4.5 4.0 - 10.5 K/uL   RBC 4.49 3.87 - 5.11 MIL/uL   Hemoglobin 10.9 (L) 12.0 - 15.0 g/dL   HCT 34.3 (L) 36.0 - 46.0 %   MCV 76.4 (L) 78.0 - 100.0 fL   MCH 24.3 (L) 26.0 - 34.0 pg   MCHC 31.8 30.0 - 36.0 g/dL   RDW 14.9 11.5 - 15.5 %   Platelets 141 (L) 150 - 400 K/uL  Protime-INR     Status: Abnormal   Collection Time: 11/04/14  1:15 PM  Result Value Ref Range   Prothrombin Time 15.3 (H) 11.6 - 15.2 seconds   INR 1.19 0.00 - 1.49  APTT     Status: None   Collection Time: 11/04/14  1:15 PM  Result Value Ref Range   aPTT 33 24 - 37 seconds    MICRO: 10/14 blood cx pending IMAGING: Mr Lumbar Spine W Wo Contrast  11/04/2014  CLINICAL DATA:  Initial evaluation for severe left leg pain, increasing with movement. Constant numbness in left leg. History of back surgery. EXAM: MRI LUMBAR SPINE WITHOUT AND WITH CONTRAST TECHNIQUE: Multiplanar and multiecho pulse sequences of the lumbar spine were obtained without and with intravenous contrast. CONTRAST:  72m MULTIHANCE GADOBENATE DIMEGLUMINE 529 MG/ML IV SOLN COMPARISON:  Prior MRI from 10/07/2014. FINDINGS: Study is mildly degraded by motion artifact. Patient is status post posterior instrumentation at L2 through S1. Alignment is stable with grade 1 anterolisthesis of L5 on S1. Enhancement about the interbody graft at L5-S1 again seen. Vertebral body heights are maintained. Postoperative changes from interval performance of decompressive laminectomy at the L1 and L2 levels. Postoperative collection at the laminectomy site measures 2.9 x 2.4 x 1.9 cm.  No significant enhancement about this collection, favoring postoperative seroma. There is superficial extension along the incisional site with small amount of fluid along the incisional wound (series 6, image 4). There is persistent abnormal T1 hypo intense, T2/STIR hyperintense signal intensity within the L1 and L2 vertebral bodies with  postcontrast enhancement, concerning for persistent osteomyelitis discitis. There is edema with enhancement within the adjacent psoas musculature, suggesting associated myositis. Irregular T2 hyperintense rim enhancing collections now seen at the left anterior margin of the superior aspect of L2, measuring 2.6 x 1.6 cm, concerning for paraspinous abscess (series 6, image 14). Additional more confluent T2 hyperintense collection more inferiorly within the left psoas muscle of the level of L4 measures 2.3 x 1.0 cm (series 6, image 28), also suspicious for abscess. There is a 4.4 x 1.7 x 2.3 cm collection within the ventral epidural space centered at the level of L1-2, but spanning from L1 through the superior aspect of L3. Associated irregular enhancement on post-contrast sequences. Findings concerning for recurrent epidural abscess. Abnormal enhancement throughout the epidural space seen at the L1-2 level, which main in large part be related to prior surgery. There is fairly severe canal stenosis at the level of L1-2 with the thecal sac measuring 10 mm in AP diameter at its most narrow point. Additional multilevel degenerative changes are grossly similar. Marked distension of the bladder noted. IMPRESSION: 1. 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 Resultant severe canal stenosis at the level of L1-2 despite interval performance of L1/L2 decompressive laminectomy. 2. 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 3. Status post L2 through S1 PLIF with interval performance of additional L1 and L2 decompressive laminectomy. Postoperative collection at the L1-2 laminectomy site as above. 4. Grade 1 anterolisthesis of L5 on S1, stable. Critical Value/emergent results were called by telephone at the time of interpretation on  11/04/2014 at 2:01 am to [place] , who verbally acknowledged these results. Electronically Signed   By: Jeannine Boga M.D.   On: 11/04/2014 02:02    Assessment/Plan: 54yo F with worsening discitis/osteomyelitis and new ventral epidural abscess  - concern for new pathogen other than viridans group strep infection. Continue with vancomycin plus ceftriaxone and rifampin 318m bid - recommend to have IR aspirate any fluid collection to send for culture to see if can isolate other pathogen - recommend urine drug screen since facility had suspicion of active drug use. If negative, can also support her case to go back to SNF after work up is complete - likely have to change her current regimen, and restart 6 wk abtx clock - await culture results to determine new regimen - if any worsening neuro symptoms, then would advocate for wash out.  CElzie RingsSFayettevillefor Infectious Diseases [phone]

## 2014-11-04 NOTE — Progress Notes (Signed)
Patient ID: Mackenzie Charles, female   DOB: May 28, 1960, 54 y.o.   MRN: [id number] Subjective:  Mackenzie Charles is a 54 year old white female well known to me. She had 6 prior back surgeries at [facility]. She was transferred emergently from the [facility] [place] [facility] with an epidural abscess about a month ago. She had an incision and drainage. She was transferred to a skilled nursing facility or completion of her antibiotics. I saw her in the office yesterday . She looked and felt fine. She was due to be discharged from the nursing home and follow-up with her pain doctor. She suddenly developed severe left leg  pain last night and came to the ER. A follow-up MRI demonstrates 2 needed osteomyelitis discitis and epidural abscess.  Objective: Vital signs in last 24 hours: Temp:  [98.1 F (36.7 C)-98.2 F (36.8 C)] 98.2 F (36.8 C) (10/14 0507) Pulse Rate:  [78-92] 78 (10/14 0507) Resp:  [18] 18 (10/14 0507) BP: (101-141)/(47-80) 123/65 mmHg (10/14 0507) SpO2:  [93 %-100 %] 100 % (10/14 0507) Weight:  [104.327 kg (230 lb)] 104.327 kg (230 lb) (10/13 2031)  Intake/Output from previous day:   Intake/Output this shift:    Physical exam the patient is alert and pleasant. He is moving her lower extremities well.  I have reviewed the patient's follow-up MRI which demonstrates L1-2 discitis, epidural abscess, osteomyelitis.  Lab Results:  Recent Labs  11/03/14 2102 11/04/14 0600  WBC 5.6 4.5  HGB 10.9* 10.9*  HCT 33.5* 34.3*  PLT 144* 141*   BMET  Recent Labs  11/03/14 2102 11/04/14 0600  NA 136 135  K 3.9 4.1  CL 99* 99*  CO2 25 27  GLUCOSE 127* 103*  BUN 7 5*  CREATININE 0.63 0.55  CALCIUM 9.3 9.3    Studies/Results: Mr Lumbar Spine W Wo Contrast  11/04/2014  CLINICAL DATA:  Initial evaluation for severe left leg pain, increasing with movement. Constant numbness in left leg. History of back surgery. EXAM: MRI LUMBAR SPINE WITHOUT AND WITH  CONTRAST TECHNIQUE: Multiplanar and multiecho pulse sequences of the lumbar spine were obtained without and with intravenous contrast. CONTRAST:  20mL MULTIHANCE GADOBENATE DIMEGLUMINE 529 MG/ML IV SOLN COMPARISON:  Prior MRI from 10/07/2014. FINDINGS: Study is mildly degraded by motion artifact. Patient is status post posterior instrumentation at L2 through S1. Alignment is stable with grade 1 anterolisthesis of L5 on S1. Enhancement about the interbody graft at L5-S1 again seen. Vertebral body heights are maintained. Postoperative changes from interval performance of decompressive laminectomy at the L1 and L2 levels. Postoperative collection at the laminectomy site measures 2.9 x 2.4 x 1.9 cm. No significant enhancement about this collection, favoring postoperative seroma. There is superficial extension along the incisional site with small amount of fluid along the incisional wound (series 6, image 4). There is persistent abnormal T1 hypo intense, T2/STIR hyperintense signal intensity within the L1 and L2 vertebral bodies with postcontrast enhancement, concerning for persistent osteomyelitis discitis. There is edema with enhancement within the adjacent psoas musculature, suggesting associated myositis. Irregular T2 hyperintense rim enhancing collections now seen at the left anterior margin of the superior aspect of L2, measuring 2.6 x 1.6 cm, concerning for paraspinous abscess (series 6, image 14). Additional more confluent T2 hyperintense collection more inferiorly within the left psoas muscle of the level of L4 measures 2.3 x 1.0 cm (series 6, image 28), also suspicious for abscess. There is a 4.4 x 1.7 x 2.3 cm collection within the ventral  epidural space centered at the level of L1-2, but spanning from L1 through the superior aspect of L3. Associated irregular enhancement on post-contrast sequences. Findings concerning for recurrent epidural abscess. Abnormal enhancement throughout the epidural space seen at  the L1-2 level, which main in large part be related to prior surgery. There is fairly severe canal stenosis at the level of L1-2 with the thecal sac measuring 10 mm in AP diameter at its most narrow point. Additional multilevel degenerative changes are grossly similar. Marked distension of the bladder noted. IMPRESSION: 1. 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 Resultant severe canal stenosis at the level of L1-2 despite interval performance of L1/L2 decompressive laminectomy. 2. 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 3. Status post L2 through S1 PLIF with interval performance of additional L1 and L2 decompressive laminectomy. Postoperative collection at the L1-2 laminectomy site as above. 4. Grade 1 anterolisthesis of L5 on S1, stable. Critical Value/emergent results were called by telephone at the time of interpretation on 11/04/2014 at 2:01 am to [facility] , who verbally acknowledged these results. Electronically Signed   By: Mackenzie MuBenjamin  Charles M.D.   On: 11/04/2014 02:02    Assessment/Plan: L1-2 epidural abscess, discitis, osteomyelitis: The patient needs to continue her antibiotics. She will need to have a repeat lumbar MRI in about a month and [facility] her 6 prior lumbar surgeries. Please call if I can be of further assistance.  LOS: 0 days     Orchid Glassberg D 11/04/2014, 8:03 AM

## 2014-11-04 NOTE — H&P (Signed)
History and Physical  Mackenzie Charles  ZOX:[id number]  DOB: 1960-04-04  DOA: 11/03/2014  Referring physician: Elpidio Anis, PA-C PCP: Pcp Not In System   Chief Complaint: Leg pain  HPI: Mackenzie Charles is a 54 y.o. female with a past medical history significant for multiple back surgeries, admitted 1 month ago with osteomyelitis/discitis, epidural abscess, and psoas myositis s/p debridement on 10/07/14 by Dr. Lovell Sheehan who presents with left leg pain.  The patient was discharged with PICC line on IV ceftriaxone 1 month ago. She has been rehabilitating at her nursing home successfully since then, able to walk 50 steps with a walker without weakness or pain, until today after her appointment with Dr. Lovell Sheehan when she tried to get up from a bed and couldn't because of excruciating low back pain and pain shooting from her left low back to her foot. At no point this week did she have fever, chills, nausea, body aches.   In the ED, the patient was afebrile, hemodynamically stable, and had no evidence of leukocytosis. An MRI of the lumbar back was performed which appeared to show a persistent or recurrent 4 x 2 x 1 cm ventral epidural abscess with canal stenosis as well as persistent myositis and small abscesses. The case was discussed with neurosurgery on call who asked that the patient be admitted for medical therapy.  Of note, there was some concern in nursing home notes that the patient was leaving premises and returning under the influence and perhaps misusing her PICC line. She had requested to be discharged, and was scheduled to be discharged on Friday of this week with rifampin and cephalexin and having the PICC line pulled.     Review of Systems:  Patient seen 3:34 AM on 11/04/2014. Pt complains of left back and hip pain. Pt denies any fever, chills, periurethral numbness, difficulty urinating, weakness.  All other systems negative except as just noted or noted in the  history of present illness.  Past Medical History  Diagnosis Date  . Depression   . Chronic back pain   . GERD (gastroesophageal reflux disease)   . Chronic pain   The above past medical history was reviewed.  Past Surgical History  Procedure Laterality Date  . Cervical fusion    . Lumbar laminectomy/decompression microdiscectomy  10/07/2014    Procedure: LUMBAR LAMINECTOMY  Lumbar one - two;  Surgeon: Tressie Stalker, MD;  Location: [facility];  Service: Neurosurgery;;  The above surgical history was reviewed.  Social History: Patient lives in [place]. She has been up here visiting her daughter who lives in the area. She is an active smoker.     No Known Allergies  Family History  Problem Relation Age of Onset  . Drug abuse Other     Prior to Admission medications   Medication Sig Start Date End Date Taking? Authorizing Provider  buPROPion (WELLBUTRIN) 100 MG tablet Take 100 mg by mouth 2 (two) times daily.   Yes Historical Provider, MD  clotrimazole (LOTRIMIN) 1 % cream Apply 1 application topically 2 (two) times daily.   Yes Historical Provider, MD  cyclobenzaprine (FLEXERIL) 10 MG tablet Take 10 mg by mouth 3 (three) times daily as needed for muscle spasms.   Yes Historical Provider, MD  gabapentin (NEURONTIN) 100 MG capsule Take 300 mg by mouth 3 (three) times daily.    Yes Historical Provider, MD  HYDROmorphone (DILAUDID) 4 MG tablet Take 16 mg by mouth daily. Patient states she takes 4 tablets  every day for breakthrough pain per pain   Yes Historical Provider, MD  methadone (DOLOPHINE) 10 MG tablet Take 60 mg by mouth daily. Take 6 tablets every day per patient   Yes Historical Provider, MD  omeprazole (PRILOSEC) 40 MG capsule Take 40 mg by mouth daily.   Yes Historical Provider, MD  promethazine (PHENERGAN) 25 MG tablet Take 25 mg by mouth every 6 (six) hours as needed for nausea or vomiting.   Yes Historical Provider, MD  sertraline (ZOLOFT) 100 MG tablet Take 100  mg by mouth daily.   Yes Historical Provider, MD    Physical Exam: BP 109/49 mmHg  Pulse 86  Temp(Src) 98.1 F (36.7 C) (Oral)  Resp 18  Ht  (1.702 m)  Wt 104.327 kg (230 lb)  BMI 36.01 kg/m2  SpO2 93% General appearance: Obese, adult female, sleepy from hydromorphone, and in no acute distress.  Responds appropriately to questions.  In pain with movement of the left leg. Eyes: Sclerae normal without icterus, conjunctiva pink, lids and lashes normal.  PERRL and EOMI.   Nose: No deformity, discharge, or epistaxis.   Mouth: OP moist without erythema, exudates, cobblestoning, or ulcers.  No airway deformities.   Skin: Warm and dry.   No suspicious rashes or lesions.  The skin overlying the low back does not appear red or swollen. No redness or tenderness around the PICC line site. Cardiac: RRR, nl S1-S2, no murmurs appreciated.   Respiratory: Normal respiratory rate and rhythm.  CTAB without rales or wheezes. Abdomen: BS present.  Abdomen soft without rigidity or tenderness. MSK: Exam is limited by pain.  There is pain with extension of the left hip.  There is no pain with palpation of the left thigh.   Neuro: Sensorium intact.  Cranial nerves grossly intact.  Speech is fluent.  Strength testing of the lower extremities is limited by patient pain with movement and poor cooperation, but appears to be 4/5 bilaterally.    Psych: Appropriate affect.  Speech normal. Thought content/process linear/appropriate.  No evidence of aural or visual hallucinations or delusions.       Labs on Admission:  The metabolic panel is notable for normal sodium, potassium, bicarbonate, and renal function. The glucose is normal. The WBC is 5.6K/uL. Chronic microcytic anemia. Stable from a cytopenia Review of previous culture shows urine culture was pansensitive Escherichia coli. There is a wound culture that grew viridans streptococcus.       Radiological Exams on Admission: Mr Lumbar Spine W Wo  Contrast 11/04/2014   IMPRESSION:  1. 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 Resultant severe canal stenosis at the level of L1-2 despite interval performance of L1/L2 decompressive laminectomy.  2. 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  3. Status post L2 through S1 PLIF with interval performance of additional L1 and L2 decompressive laminectomy. Postoperative collection at the L1-2 laminectomy site as above.  4. Grade 1 anterolisthesis of L5 on S1, stable. The above findings were discussed with Dr. Phill Myron who reiterated that the ventral epidural abscess appears to be a recurrence given the description of the procedure and the operative note and the size of the abscess at this time. The canal narrowing appears to be slightly less than the previous image. There are still numerous small abscesses in the psoas muscle.  Assessment/Plan  1. Epidural abscess, ostial myelitis and discitis:  The appearance of the patient's discitis and question myelitis appears to be roughly stable since discharge, which is perhaps to be expected, however the presence of the epidural abscess seems inconsistent with the drainage procedure that was done on the 16th of last month. Furthermore the continued presence of myositis and psoas muscle abscesses suggested treatment failure on ceftriaxone. The case was discussed by EDP with neurosurgery on call who recommended medical treatment at this time. Given her recent hospitalization, presence of PICC line and question of misuse, positive MRSA screen, I will cover for resistant pathogens, including Pseudomonas, and I will also cover for anaerobes. -Vancomycin and piperacillin tazobactam IV -Blood cultures x2 -Consult to neurosurgery, appreciate  recommendations -Consult infectious disease, appreciate recommendations  2. Depression:  Appears stable. -Continue home sertraline and Wellbutrin  3. GERD:  Stable. -Continue home PPI  4. Chronic pain:  Worsened by current abscess. -Continue home gabapentin and oral hydromorphone. -Oral hydromorphone 2 mg every 4 hours -Outside notes suggest that the patient was getting methadone 10 mg three times daily for chronic pain (although her pre-hospital dose was 60 mg) and that her nursing home physician was planning to titrate back up from 30 mg daily to 60 mg -Restart methadone 30 mg daily titrated up every 5 days by 10 mg -Hydromorphone 1 mg every 4 hours for breakthrough pain      DVT PPx: SCDs Diet: Regular Consultants: Neurosurgery and ID Code Status: Full   Disposition Plan:  At the time of admission, it appears that the appropriate admission status for this patient is INPATIENT. This is judged to be reasonable and necessary in order to provide the required intensity of service to ensure the patient's safety given the presenting symptoms, physical exam findings, and initial radiographic and laboratory data in the context of their chronic comorbidities.  Together, these circumstances are felt to place her/him at high risk for further clinical deterioration threatening life, limb, or organ.     Alberteen SamChristopher P Danford [facility] Pager [phone]

## 2014-11-04 NOTE — ED Notes (Signed)
Notified shari, PA-C, patient is moving to A3. She acknowledges, planning for admission.

## 2014-11-04 NOTE — Progress Notes (Signed)
Utilization review completed. Tolulope Pinkett, RN, BSN. 

## 2014-11-04 NOTE — Progress Notes (Signed)
[facility] Progress Note   Mackenzie Charles  ZOX:[id number]  DOB: May 13, 1960  DOA: 11/03/2014 PCP: Pcp Not In System  Brief narrative: Mackenzie Charles is a 54 y.o. female with history of multiple back surgeries recently admitted for osteomyelitis/discitis, epidural abscess and psoas myositis. She underwent incision and drainage and was discharged to skilled nursing facility on Rocephin and rifampin to treat strep viridans which was found in her wound culture. She returns to the hospital for left leg pain that started last night. She followed up as outpatient with neurosurgery just yesterday. Neurosurgery has reevaluated her in the hospital and feels that at this time there are no indications for surgery and that antibiotics should be continued.   Subjective: + Nausea. No vomiting. Complains of pain in her left buttock area going down her left leg. Apparently was able to ambulate yesterday when she went to the neurosurgery office.  Assessment/Plan: Principal Problem:   Spinal epidural abscess, osteomyelitis/discitis L1-L2, paraspinous abscess, psoas abscess - Imaging further reveals severe canal stenosis at level of L1-L2 despite having decompressive laminectomy recently -Previously grew out strep viridans -We'll ask IR to aspirate left psoas abscess - send fluid for culture -ID consult requested-vancomycin added to Rocephin and rifampin-apparently there has been a suspicion at the skilled nursing facility that the patient has been using the PICC line for drug use-the patient denies it -Continue methadone, Dilaudid and Vicodin for pain management as she was taking prior to hospitalization-will not escalate narcotics-have increased Neurontin in case there is a radicular component to her pain  Active Problems:   Depression --Continue Wellbutrin and Zoloft    Chronic back pain - on methadone as outpatient    Code Status:     Code Status Orders         Start     Ordered   11/04/14 0454  Full code   Continuous     11/04/14 0453     Family Communication:  Disposition Plan: Likely will return to skilled nursing facility DVT prophylaxis: SCDs Consultants: ID, IR, neurosurgery Procedures:   Antibiotics: Anti-infectives    Start     Dose/Rate Route Frequency Ordered Stop   11/04/14 1400  vancomycin (VANCOCIN) IVPB 1000 mg/200 mL premix     1,000 mg 200 mL/hr over 60 Minutes Intravenous Every 8 hours 11/04/14 0521     11/04/14 1000  rifampin (RIFADIN) capsule 300 mg     300 mg Oral Daily 11/04/14 0827     11/04/14 0900  cefTRIAXone (ROCEPHIN) 2 g in dextrose 5 % 50 mL IVPB     2 g 100 mL/hr over 30 Minutes Intravenous Every 24 hours 11/04/14 0826     11/04/14 0600  vancomycin (VANCOCIN) 1,500 mg in sodium chloride 0.9 % 500 mL IVPB     1,500 mg 250 mL/hr over 120 Minutes Intravenous  Once 11/04/14 0521 11/04/14 0746   11/04/14 0600  piperacillin-tazobactam (ZOSYN) IVPB 3.375 g  Status:  Discontinued     3.375 g 12.5 mL/hr over 240 Minutes Intravenous 3 times per day 11/04/14 0521 11/04/14 0941      Objective: Filed Weights   11/03/14 2031  Weight: 104.327 kg (230 lb)   No intake or output data in the 24 hours ending 11/04/14 1405   Vitals Filed Vitals:   11/04/14 0400 11/04/14 0415 11/04/14 0507 11/04/14 1017  BP: 121/79 116/61 123/65 145/85  Pulse: 89 81 78 75  Temp:   98.2 F (36.8 C) 97.8 F (36.6 C)  TempSrc:   Oral Oral  Resp:   18 18  Height:      Weight:      SpO2: 97% 98% 100% 97%    Exam:  General:  Pt is alert, not in acute distress  HEENT: No icterus, No thrush, oral mucosa moist  Cardiovascular: regular rate and rhythm, S1/S2 No murmur  Respiratory: clear to auscultation bilaterally   Abdomen: Soft, +Bowel sounds, non tender, non distended, no guarding  MSK: No LE edema, cyanosis or clubbing- tender in left buttock and lateral left thigh, no tenderness noted in lumbar area  Data  Reviewed: Basic Metabolic Panel:  Recent Labs Lab 11/03/14 2102 11/04/14 0600  NA 136 135  K 3.9 4.1  CL 99* 99*  CO2 25 27  GLUCOSE 127* 103*  BUN 7 5*  CREATININE 0.63 0.55  CALCIUM 9.3 9.3   Liver Function Tests: No results for input(s): AST, ALT, ALKPHOS, BILITOT, PROT, ALBUMIN in the last 168 hours. No results for input(s): LIPASE, AMYLASE in the last 168 hours. No results for input(s): AMMONIA in the last 168 hours. CBC:  Recent Labs Lab 11/03/14 2102 11/04/14 0600  WBC 5.6 4.5  NEUTROABS 3.8  --   HGB 10.9* 10.9*  HCT 33.5* 34.3*  MCV 76.1* 76.4*  PLT 144* 141*   Cardiac Enzymes: No results for input(s): CKTOTAL, CKMB, CKMBINDEX, TROPONINI in the last 168 hours. BNP (last 3 results) No results for input(s): BNP in the last 8760 hours.  ProBNP (last 3 results) No results for input(s): PROBNP in the last 8760 hours.  CBG: No results for input(s): GLUCAP in the last 168 hours.  Recent Results (from the past 240 hour(s))  MRSA PCR Screening     Status: None   Collection Time: 11/04/14  5:11 AM  Result Value Ref Range Status   MRSA by PCR NEGATIVE NEGATIVE Final    Comment:        The GeneXpert MRSA Assay (FDA approved for NASAL specimens only), is one component of a comprehensive MRSA colonization surveillance program. It is not intended to diagnose MRSA infection nor to guide or monitor treatment for MRSA infections.      Studies: Mr Lumbar Spine W Wo Contrast  11/04/2014  CLINICAL DATA:  Initial evaluation for severe left leg pain, increasing with movement. Constant numbness in left leg. History of back surgery. EXAM: MRI LUMBAR SPINE WITHOUT AND WITH CONTRAST TECHNIQUE: Multiplanar and multiecho pulse sequences of the lumbar spine were obtained without and with intravenous contrast. CONTRAST:  20mL MULTIHANCE GADOBENATE DIMEGLUMINE 529 MG/ML IV SOLN COMPARISON:  Prior MRI from 10/07/2014. FINDINGS: Study is mildly degraded by motion artifact.  Patient is status post posterior instrumentation at L2 through S1. Alignment is stable with grade 1 anterolisthesis of L5 on S1. Enhancement about the interbody graft at L5-S1 again seen. Vertebral body heights are maintained. Postoperative changes from interval performance of decompressive laminectomy at the L1 and L2 levels. Postoperative collection at the laminectomy site measures 2.9 x 2.4 x 1.9 cm. No significant enhancement about this collection, favoring postoperative seroma. There is superficial extension along the incisional site with small amount of fluid along the incisional wound (series 6, image 4). There is persistent abnormal T1 hypo intense, T2/STIR hyperintense signal intensity within the L1 and L2 vertebral bodies with postcontrast enhancement, concerning for persistent osteomyelitis discitis. There is edema with enhancement within the adjacent psoas musculature, suggesting associated myositis. Irregular T2 hyperintense rim enhancing collections now seen at the left anterior  margin of the superior aspect of L2, measuring 2.6 x 1.6 cm, concerning for paraspinous abscess (series 6, image 14). Additional more confluent T2 hyperintense collection more inferiorly within the left psoas muscle of the level of L4 measures 2.3 x 1.0 cm (series 6, image 28), also suspicious for abscess. There is a 4.4 x 1.7 x 2.3 cm collection within the ventral epidural space centered at the level of L1-2, but spanning from L1 through the superior aspect of L3. Associated irregular enhancement on post-contrast sequences. Findings concerning for recurrent epidural abscess. Abnormal enhancement throughout the epidural space seen at the L1-2 level, which main in large part be related to prior surgery. There is fairly severe canal stenosis at the level of L1-2 with the thecal sac measuring 10 mm in AP diameter at its most narrow point. Additional multilevel degenerative changes are grossly similar. Marked distension of the  bladder noted. IMPRESSION: 1. 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 Resultant severe canal stenosis at the level of L1-2 despite interval performance of L1/L2 decompressive laminectomy. 2. 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 3. Status post L2 through S1 PLIF with interval performance of additional L1 and L2 decompressive laminectomy. Postoperative collection at the L1-2 laminectomy site as above. 4. Grade 1 anterolisthesis of L5 on S1, stable. Critical Value/emergent results were called by telephone at the time of interpretation on 11/04/2014 at 2:01 am to [facility] , who verbally acknowledged these results. Electronically Signed   By: Rise MuBenjamin  McClintock M.D.   On: 11/04/2014 02:02    Scheduled Meds:  Scheduled Meds: . buPROPion  100 mg Oral BID  . cefTRIAXone (ROCEPHIN)  IV  2 g Intravenous Q24H  . docusate sodium  100 mg Oral BID  . gabapentin  500 mg Oral TID  . lidocaine      . methadone  10 mg Oral Daily  . methadone  20 mg Oral Q12H  . pantoprazole  40 mg Oral Daily  . rifampin  300 mg Oral Daily  . sertraline  100 mg Oral Daily  . vancomycin  1,000 mg Intravenous Q8H   Continuous Infusions:   Time spent on care of this patient: 35 min   Amylah Will, MD 11/04/2014, 2:05 PM  LOS: 0 days   [facility] Office  [phone] Pager - Text Page per [email or web address] If 7PM-7AM, please contact night-coverage [email or web address]

## 2014-11-04 NOTE — Care Management Note (Signed)
Case Management Note  Patient Details  Name: Mackenzie Charles MRN: [id number] Date of Birth: 1960-05-05  Subjective/Objective:                    Action/Plan: Patient admitted with pain in back and leg weakness. Pt with worsening of spinal epidural abscess. Pt was here several weeks ago for the same and discharged with PICC and IV antibiotics to [facility]. According to the notes patient was asking to be released from Rehab center and may have been [facility]. Awaiting MD orders and therapy recommendations for patients discharge disposition. CM will continue to follow for needs.   Expected Discharge Date:                  Expected Discharge Plan:  Skilled Nursing Facility  In-House Referral:     Discharge planning Services     Post Acute Care Choice:    Choice offered to:     DME Arranged:    DME Agency:     HH Arranged:    HH Agency:     Status of Service:  In process, will continue to follow  Medicare Important Message Given:    Date Medicare IM Given:    Medicare IM give by:    Date Additional Medicare IM Given:    Additional Medicare Important Message give by:     If discussed at Long Length of Stay Meetings, dates discussed:    Additional Comments:  Mackenzie BaloKelli F Averie Hornbaker, RN 11/04/2014, 1:27 PM

## 2014-11-04 NOTE — Procedures (Signed)
L psoas abscess aspiration 3 cc serosanguinous fluid No comp/EBL

## 2014-11-04 NOTE — Sedation Documentation (Addendum)
Bandaid to lower back- intact

## 2014-11-04 NOTE — ED Notes (Signed)
Placed pt on a few chux and a brief. Pt was able to go to the bathroom. Pt was cleaned with soap and water and clean chux were placed under the pt.

## 2014-11-04 NOTE — Progress Notes (Signed)
Pt transferred to unit from ED via Rn x 1. Pt alert and oriented upon arrival. Only complain is of back pain. No signs or symptoms of acute distress. Pt oriented to unit, as well as unit procedures. Pt now resting in bed at lowest position, bed alarm on, call light in reach. Will continue to monitor. Delfino Lovettichie Daishaun Ayre, RN, BSN 11/04/2014 5:04 AM

## 2014-11-05 DIAGNOSIS — M4806 Spinal stenosis, lumbar region: Secondary | ICD-10-CM

## 2014-11-05 DIAGNOSIS — Z9889 Other specified postprocedural states: Secondary | ICD-10-CM

## 2014-11-05 DIAGNOSIS — M4626 Osteomyelitis of vertebra, lumbar region: Secondary | ICD-10-CM

## 2014-11-05 DIAGNOSIS — K6812 Psoas muscle abscess: Secondary | ICD-10-CM

## 2014-11-05 DIAGNOSIS — B9689 Other specified bacterial agents as the cause of diseases classified elsewhere: Secondary | ICD-10-CM

## 2014-11-05 LAB — RAPID URINE DRUG SCREEN, HOSP PERFORMED
Amphetamines: NOT DETECTED
Barbiturates: NOT DETECTED
Benzodiazepines: POSITIVE — AB
Cocaine: NOT DETECTED
OPIATES: POSITIVE — AB
TETRAHYDROCANNABINOL: NOT DETECTED

## 2014-11-05 LAB — VANCOMYCIN, TROUGH: Vancomycin Tr: 18 ug/mL (ref 10.0–20.0)

## 2014-11-05 MED ORDER — VANCOMYCIN HCL IN DEXTROSE 750-5 MG/150ML-% IV SOLN
750.0000 mg | Freq: Three times a day (TID) | INTRAVENOUS | Status: DC
  Administered 2014-11-05 – 2014-11-07 (×6): 750 mg via INTRAVENOUS
  Filled 2014-11-05 (×8): qty 150

## 2014-11-05 NOTE — Progress Notes (Signed)
ANTIBIOTIC CONSULT NOTE - FOLLOW UP  Pharmacy Consult for vancomycin  Indication: spinal osteomyelitis and psoas abscess  No Known Allergies Patient Measurements: Height: 5\' 7"  (170.2 cm) Weight: 230 lb (104.327 kg) IBW/kg (Calculated) : 61.6 Vital Signs: Temp: 98 F (36.7 C) (10/15 1709) Temp Source: Oral (10/15 1709) BP: 141/70 mmHg (10/15 1709) Pulse Rate: 83 (10/15 1709) Intake/Output from previous day: 10/14 0701 - 10/15 0700 In: 360 [P.O.:360] Out: -  Labs:  Recent Labs  11/03/14 2102 11/04/14 0600  WBC 5.6 4.5  HGB 10.9* 10.9*  PLT 144* 141*  CREATININE 0.63 0.55   Estimated Creatinine Clearance: 99.9 mL/min (by C-G formula based on Cr of 0.55).  Recent Labs  11/05/14 1645  VANCOTROUGH 18     Microbiology: 10/14 MRSA screen neg 10/14 blood x 2 - 10/14 psoas abscess - Prior admit: 9/17 abscess cx grew microaerophilic Strep per ID (I don't see results in Epic)   Assessment: 54 year old female with spinal osteomyelitis and psoas abscess s/p IR aspiration on vancomycin per pharmacy dosing and ceftriaxone.   Vancomycin trough = 18 (drawn 3hrs late). After correcting for late trough, actual trough ~21, slightly above goal and likely to accumulate with extended dosing.   Goal of Therapy:  Vancomycin trough level 15-20 mcg/ml  Plan:  Adjust vancomycin down to 750 mg IV every 8 hours.  Follow-up renal function, culture results, and clinical status. Recheck vancomycin levels weekly while on therapy if continued long-term.   Link SnufferJessica Loreta Blouch, PharmD, BCPS Clinical Pharmacist [phone] 11/05/2014,5:40 PM

## 2014-11-05 NOTE — Progress Notes (Addendum)
[facility] Progress Note   [name]  GNF:[id number]  DOB: October 18, 1960  DOA: 11/03/2014 PCP: Pcp Not In System  Brief narrative: Mackenzie Charles is a 54 y.o. female with history of multiple back surgeries recently admitted for osteomyelitis/discitis, epidural abscess and psoas myositis. She underwent incision and drainage and was discharged to skilled nursing facility on Rocephin and rifampin to treat strep viridans which was found in her wound culture. She returns to the hospital for left leg pain that started last night. She followed up as outpatient with neurosurgery just yesterday. Neurosurgery has reevaluated her in the hospital and feels that at this time there are no indications for surgery and that antibiotics should be continued.   Subjective: Continues to have pain in left buttock area which was controlled well last night with narcotics. No new complaints.   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, osteomyelitis/discitis L1-L2, paraspinous abscesses, b/l psoas abscess/ myositis - Imaging further reveals severe canal stenosis at level of L1-L2 despite having decompressive laminectomy recently -Previously grew out strep viridans- have asked IR to aspirate left psoas abscess- await culture result -ID consult requested-vancomycin added to Rocephin and rifampin-apparently there has been a suspicion at the skilled nursing facility that the patient has been using the PICC line for drug use-the patient denies it- UDS reveals Opiates and Benzo's which she has received during the hospital stay - NS recommending to continue antibiotics and repeating MRI in 1 month -Continue methadone, Dilaudid and Vicodin for pain management as she was taking prior to hospitalization-will not escalate narcotics-have increased Neurontin in case there is a radicular component to her pain  Active Problems:   Depression --Continue Wellbutrin and  Zoloft    Chronic back pain - on methadone as outpatient    Code Status:     Code Status Orders        Start     Ordered   11/04/14 0454  Full code   Continuous     11/04/14 0453     Family Communication:  Disposition Plan: Likely will return to skilled nursing facility DVT prophylaxis: SCDs Consultants: ID, IR, neurosurgery Procedures:   Antibiotics: Anti-infectives    Start     Dose/Rate Route Frequency Ordered Stop   11/04/14 2200  rifampin (RIFADIN) capsule 300 mg     300 mg Oral 2 times daily 11/04/14 1655     11/04/14 1400  vancomycin (VANCOCIN) IVPB 1000 mg/200 mL premix     1,000 mg 200 mL/hr over 60 Minutes Intravenous Every 8 hours 11/04/14 0521     11/04/14 1000  rifampin (RIFADIN) capsule 300 mg  Status:  Discontinued     300 mg Oral Daily 11/04/14 0827 11/04/14 1655   11/04/14 0900  cefTRIAXone (ROCEPHIN) 2 g in dextrose 5 % 50 mL IVPB     2 g 100 mL/hr over 30 Minutes Intravenous Every 24 hours 11/04/14 0826     11/04/14 0600  vancomycin (VANCOCIN) 1,500 mg in sodium chloride 0.9 % 500 mL IVPB     1,500 mg 250 mL/hr over 120 Minutes Intravenous  Once 11/04/14 0521 11/04/14 0746   11/04/14 0600  piperacillin-tazobactam (ZOSYN) IVPB 3.375 g  Status:  Discontinued     3.375 g 12.5 mL/hr over 240 Minutes Intravenous 3 times per day 11/04/14 0521 11/04/14 0941      Objective: Filed Weights   11/03/14 2031  Weight: 104.327 kg (230 lb)    Intake/Output Summary (Last 24 hours) at  11/05/14 1309 Last data filed at 11/05/14 0330  Gross per 24 hour  Intake    360 ml  Output      0 ml  Net    360 ml     Vitals Filed Vitals:   11/05/14 0212 11/05/14 0529 11/05/14 0536 11/05/14 0953  BP: 148/80 115/55 139/65 144/71  Pulse: 76 77 65 76  Temp: 98.6 F (37 C) 98.2 F (36.8 C) 98.5 F (36.9 C) 98 F (36.7 C)  TempSrc: Oral Oral Oral Oral  Resp: Height:      Weight:      SpO2: 97% 97% 99% 100%    Exam:  General:  Pt is alert, not  in acute distress  HEENT: No icterus, No thrush, oral mucosa moist  Cardiovascular: regular rate and rhythm, S1/S2 No murmur  Respiratory: clear to auscultation bilaterally   Abdomen: Soft, +Bowel sounds, non tender, non distended, no guarding  MSK: No LE edema, cyanosis or clubbing- tender in left buttock and lateral left thigh, no tenderness noted in lumbar area-   Data Reviewed: Basic Metabolic Panel:  Recent Labs Lab 11/03/14 2102 11/04/14 0600  NA 136 135  K 3.9 4.1  CL 99* 99*  CO2 25 27  GLUCOSE 127* 103*  BUN 7 5*  CREATININE 0.63 0.55  CALCIUM 9.3 9.3   Liver Function Tests: No results for input(s): AST, ALT, ALKPHOS, BILITOT, PROT, ALBUMIN in the last 168 hours. No results for input(s): LIPASE, AMYLASE in the last 168 hours. No results for input(s): AMMONIA in the last 168 hours. CBC:  Recent Labs Lab 11/03/14 2102 11/04/14 0600  WBC 5.6 4.5  NEUTROABS 3.8  --   HGB 10.9* 10.9*  HCT 33.5* 34.3*  MCV 76.1* 76.4*  PLT 144* 141*   Cardiac Enzymes: No results for input(s): CKTOTAL, CKMB, CKMBINDEX, TROPONINI in the last 168 hours. BNP (last 3 results) No results for input(s): BNP in the last 8760 hours.  ProBNP (last 3 results) No results for input(s): PROBNP in the last 8760 hours.  CBG: No results for input(s): GLUCAP in the last 168 hours.  Recent Results (from the past 240 hour(s))  MRSA PCR Screening     Status: None   Collection Time: 11/04/14  5:11 AM  Result Value Ref Range Status   MRSA by PCR NEGATIVE NEGATIVE Final    Comment:        The GeneXpert MRSA Assay (FDA approved for NASAL specimens only), is one component of a comprehensive MRSA colonization surveillance program. It is not intended to diagnose MRSA infection nor to guide or monitor treatment for MRSA infections.      Studies: Mr Lumbar Spine W Wo Contrast  11/04/2014  CLINICAL DATA:  Initial evaluation for severe left leg pain, increasing with movement. Constant  numbness in left leg. History of back surgery. EXAM: MRI LUMBAR SPINE WITHOUT AND WITH CONTRAST TECHNIQUE: Multiplanar and multiecho pulse sequences of the lumbar spine were obtained without and with intravenous contrast. CONTRAST:  20mL MULTIHANCE GADOBENATE DIMEGLUMINE 529 MG/ML IV SOLN COMPARISON:  Prior MRI from 10/07/2014. FINDINGS: Study is mildly degraded by motion artifact. Patient is status post posterior instrumentation at L2 through S1. Alignment is stable with grade 1 anterolisthesis of L5 on S1. Enhancement about the interbody graft at L5-S1 again seen. Vertebral body heights are maintained. Postoperative changes from interval performance of decompressive laminectomy at the L1 and L2 levels. Postoperative collection at the laminectomy site measures 2.9 x  2.4 x 1.9 cm. No significant enhancement about this collection, favoring postoperative seroma. There is superficial extension along the incisional site with small amount of fluid along the incisional wound (series 6, image 4). There is persistent abnormal T1 hypo intense, T2/STIR hyperintense signal intensity within the L1 and L2 vertebral bodies with postcontrast enhancement, concerning for persistent osteomyelitis discitis. There is edema with enhancement within the adjacent psoas musculature, suggesting associated myositis. Irregular T2 hyperintense rim enhancing collections now seen at the left anterior margin of the superior aspect of L2, measuring 2.6 x 1.6 cm, concerning for paraspinous abscess (series 6, image 14). Additional more confluent T2 hyperintense collection more inferiorly within the left psoas muscle of the level of L4 measures 2.3 x 1.0 cm (series 6, image 28), also suspicious for abscess. There is a 4.4 x 1.7 x 2.3 cm collection within the ventral epidural space centered at the level of L1-2, but spanning from L1 through the superior aspect of L3. Associated irregular enhancement on post-contrast sequences. Findings concerning for  recurrent epidural abscess. Abnormal enhancement throughout the epidural space seen at the L1-2 level, which main in large part be related to prior surgery. There is fairly severe canal stenosis at the level of L1-2 with the thecal sac measuring 10 mm in AP diameter at its most narrow point. Additional multilevel degenerative changes are grossly similar. Marked distension of the bladder noted. IMPRESSION: 1. 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 Resultant severe canal stenosis at the level of L1-2 despite interval performance of L1/L2 decompressive laminectomy. 2. 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 3. Status post L2 through S1 PLIF with interval performance of additional L1 and L2 decompressive laminectomy. Postoperative collection at the L1-2 laminectomy site as above. 4. Grade 1 anterolisthesis of L5 on S1, stable. Critical Value/emergent results were called by telephone at the time of interpretation on 11/04/2014 at 2:01 am to [facility] UPSTILL , who verbally acknowledged these results. Electronically Signed   By: Rise Mu M.D.   On: 11/04/2014 02:02   Ct Aspiration  11/04/2014  CLINICAL DATA:  Discitis at L1-2 EXAM: CT-GUIDED  LEFT PSOAS ABSCESS ASPIRATION MEDICATIONS AND MEDICAL HISTORY: Versed 3 mg, Fentanyl 100 mcg. Additional Medications: NONE. ANESTHESIA/SEDATION: Moderate sedation time: 8 minutes PROCEDURE: The procedure, risks, benefits, and alternatives were explained to the patient. Questions regarding the procedure were encouraged and answered. The patient understands and consents to the procedure. The BACK was prepped with Betadine in a sterile fashion, and a sterile drape was applied covering the operative field. A sterile gown and sterile gloves were used for the procedure. Under  CT guidance, an 18 gauge needle was advanced into the abscess anterior and to the left of the L2 vertebral body within the psoas muscle. 3 cc serosanguineous fluid was aspirated and sent for culture. Final imaging was performed. Patient tolerated the procedure well without complication. Vital sign monitoring by nursing staff during the procedure will continue as patient is in the special procedures unit for post procedure observation. FINDINGS: The images document guide needle placement within the left psoas abscess at L2. Post biopsy images demonstrate no hemorrhage. COMPLICATIONS: None IMPRESSION: Successful CT-guided aspiration of a left psoas abscess at L2. Electronically Signed   By: Jolaine Click M.D.   On: 11/04/2014 16:53    Scheduled Meds:  Scheduled Meds: . buPROPion  100 mg Oral BID  . cefTRIAXone (ROCEPHIN)  IV  2 g Intravenous Q24H  . docusate sodium  100 mg Oral BID  . gabapentin  500 mg Oral TID  . methadone  10 mg Oral Daily  . methadone  20 mg Oral Q12H  . pantoprazole  40 mg Oral Daily  . rifampin  300 mg Oral BID  . sertraline  100 mg Oral Daily  . vancomycin  1,000 mg Intravenous Q8H   Continuous Infusions:   Time spent on care of this patient: 35 min   Kevyn Wengert, MD 11/05/2014, 1:09 PM  LOS: 1 day   [facility] Office  [phone] Pager - Text Page per [email or web address] If 7PM-7AM, please contact night-coverage [email or web address]

## 2014-11-05 NOTE — Progress Notes (Signed)
[facility]    Date of Admission:  11/03/2014   Total days of antibiotics 27        Day 2 vanco        Day 27 ceftriaxone           ID: Mackenzie Charles is a 54 y.o. female with  Principal Problem:   Spinal epidural abscess Active Problems:   Depression   Chronic back pain   GERD (gastroesophageal reflux disease)   Neuropathy (HCC)   Discitis of lumbar region   Spinal abscess (HCC)   Radicular pain of left lower extremity    Subjective: Underwent left psoas abscess IR aspiration. The patient still having pain and c/o new onset numbness to dorsum of left foot and ongoing pain/numbness to lateral aspect of left upper leg (old symptom  Medications:  . buPROPion  100 mg Oral BID  . cefTRIAXone (ROCEPHIN)  IV  2 g Intravenous Q24H  . docusate sodium  100 mg Oral BID  . gabapentin  500 mg Oral TID  . methadone  10 mg Oral Daily  . methadone  20 mg Oral Q12H  . pantoprazole  40 mg Oral Daily  . rifampin  300 mg Oral BID  . sertraline  100 mg Oral Daily  . vancomycin  1,000 mg Intravenous Q8H    Objective: Vital signs in last 24 hours: Temp:  [98 F (36.7 C)-98.6 F (37 C)] 98 F (36.7 C) (10/15 1354) Pulse Rate:  [65-82] 82 (10/15 1354) Resp:  [18] 18 (10/15 1354) BP: (115-155)/(55-90) 145/71 mmHg (10/15 1354) SpO2:  [97 %-100 %] 98 % (10/15 1354) Physical Exam  Constitutional:  oriented to person, place, and time. appears well-developed and well-nourished. No distress.  HENT: [place]/AT, PERRLA, no scleral icterus Mouth/Throat: Oropharynx is clear and moist. No oropharyngeal exudate.  Cardiovascular: Normal rate, regular rhythm and normal heart sounds. Exam reveals no gallop and no friction rub.  No murmur heard.  Pulmonary/Chest: Effort normal and breath sounds normal. No respiratory distress.  has no wheezes.  Neurological: alert and oriented to person, place, and time. Decrease sensation on dorsum of left foot  Skin: Skin is warm  and dry. No rash noted. No erythema.  Psychiatric: a normal mood and affect.  behavior is normal.     Lab Results  Recent Labs  11/03/14 2102 11/04/14 0600  WBC 5.6 4.5  HGB 10.9* 10.9*  HCT 33.5* 34.3*  NA 136 135  K 3.9 4.1  CL 99* 99*  CO2 25 27  BUN 7 5*  CREATININE 0.63 0.55   Lab Results  Component Value Date   ESRSEDRATE 87* 10/07/2014    Microbiology: 10/14 aspirate NGTD 10/14 blood cx ngtd Studies/Results: Mr Lumbar Spine W Wo Contrast  11/04/2014  CLINICAL DATA:  Initial evaluation for severe left leg pain, increasing with movement. Constant numbness in left leg. History of back surgery. EXAM: MRI LUMBAR SPINE WITHOUT AND WITH CONTRAST TECHNIQUE: Multiplanar and multiecho pulse sequences of the lumbar spine were obtained without and with intravenous contrast. CONTRAST:  20mL MULTIHANCE GADOBENATE DIMEGLUMINE 529 MG/ML IV SOLN COMPARISON:  Prior MRI from 10/07/2014. FINDINGS: Study is mildly degraded by motion artifact. Patient is status post posterior instrumentation at L2 through S1. Alignment is stable with grade 1 anterolisthesis of L5 on S1. Enhancement about the interbody graft at L5-S1 again seen. Vertebral body heights are maintained. Postoperative changes from interval performance of decompressive laminectomy at the L1 and L2 levels.  Postoperative collection at the laminectomy site measures 2.9 x 2.4 x 1.9 cm. No significant enhancement about this collection, favoring postoperative seroma. There is superficial extension along the incisional site with small amount of fluid along the incisional wound (series 6, image 4). There is persistent abnormal T1 hypo intense, T2/STIR hyperintense signal intensity within the L1 and L2 vertebral bodies with postcontrast enhancement, concerning for persistent osteomyelitis discitis. There is edema with enhancement within the adjacent psoas musculature, suggesting associated myositis. Irregular T2 hyperintense rim enhancing  collections now seen at the left anterior margin of the superior aspect of L2, measuring 2.6 x 1.6 cm, concerning for paraspinous abscess (series 6, image 14). Additional more confluent T2 hyperintense collection more inferiorly within the left psoas muscle of the level of L4 measures 2.3 x 1.0 cm (series 6, image 28), also suspicious for abscess. There is a 4.4 x 1.7 x 2.3 cm collection within the ventral epidural space centered at the level of L1-2, but spanning from L1 through the superior aspect of L3. Associated irregular enhancement on post-contrast sequences. Findings concerning for recurrent epidural abscess. Abnormal enhancement throughout the epidural space seen at the L1-2 level, which main in large part be related to prior surgery. There is fairly severe canal stenosis at the level of L1-2 with the thecal sac measuring 10 mm in AP diameter at its most narrow point. Additional multilevel degenerative changes are grossly similar. Marked distension of the bladder noted. IMPRESSION: 1. 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 Resultant severe canal stenosis at the level of L1-2 despite interval performance of L1/L2 decompressive laminectomy. 2. 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 3. Status post L2 through S1 PLIF with interval performance of additional L1 and L2 decompressive laminectomy. Postoperative collection at the L1-2 laminectomy site as above. 4. Grade 1 anterolisthesis of L5 on S1, stable. Critical Value/emergent results were called by telephone at the time of interpretation on 11/04/2014 at 2:01 am [facility] , who verbally acknowledged these results. Electronically Signed   By: Rise MuBenjamin  McClintock M.D.   On: 11/04/2014 02:02   Ct Aspiration  11/04/2014  CLINICAL DATA:  Discitis at  L1-2 EXAM: CT-GUIDED  LEFT PSOAS ABSCESS ASPIRATION MEDICATIONS AND MEDICAL HISTORY: Versed 3 mg, Fentanyl 100 mcg. Additional Medications: NONE. ANESTHESIA/SEDATION: Moderate sedation time: 8 minutes PROCEDURE: The procedure, risks, benefits, and alternatives were explained to the patient. Questions regarding the procedure were encouraged and answered. The patient understands and consents to the procedure. The BACK was prepped with Betadine in a sterile fashion, and a sterile drape was applied covering the operative field. A sterile gown and sterile gloves were used for the procedure. Under CT guidance, an 18 gauge needle was advanced into the abscess anterior and to the left of the L2 vertebral body within the psoas muscle. 3 cc serosanguineous fluid was aspirated and sent for culture. Final imaging was performed. Patient tolerated the procedure well without complication. Vital sign monitoring by nursing staff during the procedure will continue as patient is in the special procedures unit for post procedure observation. FINDINGS: The images document guide needle placement within the left psoas abscess at L2. Post biopsy images demonstrate no hemorrhage. COMPLICATIONS: None IMPRESSION: Successful CT-guided aspiration of a left psoas abscess at L2. Electronically Signed   By: Dahlia ClientArthur  Hoss M.D.  On: 11/04/2014 16:53     Assessment/Plan: wrosening L1-L2 discitis with recurrent epidural abscess = continue on vancomycin and ceftriaxone for now. Await cx for L-psoas abscess aspriate to see if possible 2nd pathogen involved. Recommend to check in with neurosurgery to see if numbness to left dorsum of foot if any concern given MRI findings with ventral epidural abscess and L1-L2 severe stenosis.  Patient inquiring about going home to do IV abtx which i think would be a bad idea, better care to be back at snf.  Drue Second [facility] Cell: [phone] Pager:  [phone]  11/05/2014, 2:55 PM

## 2014-11-05 NOTE — Progress Notes (Signed)
This encounter was created in error - please disregard.

## 2014-11-06 MED ORDER — DICLOFENAC SODIUM 1 % TD GEL
2.0000 g | Freq: Four times a day (QID) | TRANSDERMAL | Status: DC
  Administered 2014-11-06 – 2014-11-08 (×9): 2 g via TOPICAL
  Filled 2014-11-06: qty 100

## 2014-11-06 MED ORDER — HYDROMORPHONE HCL 1 MG/ML IJ SOLN
1.0000 mg | INTRAMUSCULAR | Status: DC | PRN
  Administered 2014-11-07 – 2014-11-08 (×8): 2 mg via INTRAVENOUS
  Filled 2014-11-06 (×8): qty 2
  Filled 2014-11-06: qty 1

## 2014-11-06 MED ORDER — HYDROMORPHONE HCL 1 MG/ML IJ SOLN
1.0000 mg | Freq: Once | INTRAMUSCULAR | Status: AC
  Administered 2014-11-06: 1 mg via INTRAVENOUS

## 2014-11-06 MED ORDER — GABAPENTIN 300 MG PO CAPS
600.0000 mg | ORAL_CAPSULE | Freq: Three times a day (TID) | ORAL | Status: DC
  Administered 2014-11-06 – 2014-11-08 (×8): 600 mg via ORAL
  Filled 2014-11-06 (×8): qty 2

## 2014-11-06 NOTE — Progress Notes (Signed)
[facility] Progress Note   Mackenzie NapoleonVictoria Renee Charles  ZOX:[id number]  DOB: [birth year]  DOA: 11/03/2014 PCP: Pcp Not In System  Brief narrative: Mackenzie MageVictoria Renee Charles is a 54 y.o. female with history of multiple back surgeries recently admitted for osteomyelitis/discitis, epidural abscess and psoas myositis. She underwent incision and drainage and was discharged to skilled nursing facility on Rocephin and rifampin to treat strep viridans which was found in her wound culture. She returns to the hospital for left leg pain that started last night. She followed up as outpatient with neurosurgery just yesterday. Neurosurgery has reevaluated her in the hospital and feels that at this time there are no indications for surgery and that antibiotics should be continued.   Subjective: Ongoing pain in left buttock area and numbness in left thigh. Shooting pains have resolved. Able to ambulate to bathroom without diffuculty.   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, osteomyelitis/discitis L1-L2, paraspinous abscesses, b/l psoas abscess/ myositis - Imaging further reveals severe canal stenosis at level of L1-L2 despite having decompressive laminectomy recently -Previously grew out strep viridans- have asked IR to aspirate left psoas abscess- await culture result -ID consult requested-vancomycin added to Rocephin and rifampin-apparently there has been a suspicion at the skilled nursing facility that the patient has been using the PICC line for drug use-the patient denies it- UDS reveals Opiates and Benzo's which she has received prior to and during the hospital stay - NS recommending to continue antibiotics and repeating MRI in 1 month -Continue methadone, Dilaudid and Vicodin for pain management as she was taking prior to hospitalization-will not escalate narcotics-have increased Neurontin in case there is a radicular component to her pain  Active Problems:    Depression --Continue Wellbutrin and Zoloft    Chronic back pain - on methadone as outpatient chronically     Code Status:     Code Status Orders        Start     Ordered   11/04/14 0454  Full code   Continuous     11/04/14 0453     Family Communication:  Disposition Plan: Likely will return to skilled nursing facility DVT prophylaxis: SCDs Consultants: ID, IR, neurosurgery Procedures:   Antibiotics: Anti-infectives    Start     Dose/Rate Route Frequency Ordered Stop   11/05/14 2000  vancomycin (VANCOCIN) IVPB 750 mg/150 ml premix     750 mg 150 mL/hr over 60 Minutes Intravenous Every 8 hours 11/05/14 1739     11/04/14 2200  rifampin (RIFADIN) capsule 300 mg     300 mg Oral 2 times daily 11/04/14 1655     11/04/14 1400  vancomycin (VANCOCIN) IVPB 1000 mg/200 mL premix  Status:  Discontinued     1,000 mg 200 mL/hr over 60 Minutes Intravenous Every 8 hours 11/04/14 0521 11/05/14 1739   11/04/14 1000  rifampin (RIFADIN) capsule 300 mg  Status:  Discontinued     300 mg Oral Daily 11/04/14 0827 11/04/14 1655   11/04/14 0900  cefTRIAXone (ROCEPHIN) 2 g in dextrose 5 % 50 mL IVPB     2 g 100 mL/hr over 30 Minutes Intravenous Every 24 hours 11/04/14 0826     11/04/14 0600  vancomycin (VANCOCIN) 1,500 mg in sodium chloride 0.9 % 500 mL IVPB     1,500 mg 250 mL/hr over 120 Minutes Intravenous  Once 11/04/14 0521 11/04/14 0746   11/04/14 0600  piperacillin-tazobactam (ZOSYN) IVPB 3.375 g  Status:  Discontinued  3.375 g 12.5 mL/hr over 240 Minutes Intravenous 3 times per day 11/04/14 0521 11/04/14 0941      Objective: Filed Weights   11/03/14 2031  Weight: 104.327 kg (230 lb)   No intake or output data in the 24 hours ending 11/06/14 1215   Vitals Filed Vitals:   11/05/14 2225 11/06/14 0110 11/06/14 0536 11/06/14 0917  BP: 132/67 145/67 140/73 163/101  Pulse: 80 84 79 91  Temp: 98.6 F (37 C) 98.7 F (37.1 C) 98.5 F (36.9 C) 98 F (36.7 C)  TempSrc: Oral  Oral Oral Oral  Resp: Height:      Weight:      SpO2: 97% 96% 95% 100%    Exam:  General:  Pt is alert, not in acute distress  HEENT: No icterus, No thrush, oral mucosa moist  Cardiovascular: regular rate and rhythm, S1/S2 No murmur  Respiratory: clear to auscultation bilaterally   Abdomen: Soft, +Bowel sounds, non tender, non distended, no guarding  MSK: No LE edema, cyanosis or clubbing- tender in left buttock and lateral left thigh, no tenderness noted in lumbar area- subjective numbness in left anterior thigh- can feel pain   Data Reviewed: Basic Metabolic Panel:  Recent Labs Lab 11/03/14 2102 11/04/14 0600  NA 136 135  K 3.9 4.1  CL 99* 99*  CO2 25 27  GLUCOSE 127* 103*  BUN 7 5*  CREATININE 0.63 0.55  CALCIUM 9.3 9.3   Liver Function Tests: No results for input(s): AST, ALT, ALKPHOS, BILITOT, PROT, ALBUMIN in the last 168 hours. No results for input(s): LIPASE, AMYLASE in the last 168 hours. No results for input(s): AMMONIA in the last 168 hours. CBC:  Recent Labs Lab 11/03/14 2102 11/04/14 0600  WBC 5.6 4.5  NEUTROABS 3.8  --   HGB 10.9* 10.9*  HCT 33.5* 34.3*  MCV 76.1* 76.4*  PLT 144* 141*   Cardiac Enzymes: No results for input(s): CKTOTAL, CKMB, CKMBINDEX, TROPONINI in the last 168 hours. BNP (last 3 results) No results for input(s): BNP in the last 8760 hours.  ProBNP (last 3 results) No results for input(s): PROBNP in the last 8760 hours.  CBG: No results for input(s): GLUCAP in the last 168 hours.  Recent Results (from the past 240 hour(s))  MRSA PCR Screening     Status: None   Collection Time: 11/04/14  5:11 AM  Result Value Ref Range Status   MRSA by PCR NEGATIVE NEGATIVE Final    Comment:        The GeneXpert MRSA Assay (FDA approved for NASAL specimens only), is one component of a comprehensive MRSA colonization surveillance program. It is not intended to diagnose MRSA infection nor to guide or monitor  treatment for MRSA infections.   Culture, blood (routine x 2)     Status: None (Preliminary result)   Collection Time: 11/04/14  5:45 AM  Result Value Ref Range Status   Specimen Description BLOOD LEFT ANTECUBITAL  Final   Special Requests BOTTLES DRAWN AEROBIC AND ANAEROBIC 10CC  Final   Culture NO GROWTH 1 DAY  Final   Report Status PENDING  Incomplete  Culture, blood (routine x 2)     Status: None (Preliminary result)   Collection Time: 11/04/14  6:00 AM  Result Value Ref Range Status   Specimen Description BLOOD BLOOD LEFT HAND  Final   Special Requests BOTTLES DRAWN AEROBIC AND ANAEROBIC 10CC  Final   Culture NO GROWTH 1 DAY  Final  Report Status PENDING  Incomplete  Culture, routine-abscess     Status: None (Preliminary result)   Collection Time: 11/04/14  3:30 PM  Result Value Ref Range Status   Specimen Description ABSCESS  Final   Special Requests LEFT PSOAS  Final   Gram Stain   Final    FEW WBC PRESENT, PREDOMINANTLY PMN NO SQUAMOUS EPITHELIAL CELLS SEEN NO ORGANISMS SEEN Performed at [facility]    Culture   Final    NO GROWTH 1 DAY Performed at [facility]    Report Status PENDING  Incomplete     Studies: Ct Aspiration  11/04/2014  CLINICAL DATA:  Discitis at L1-2 EXAM: CT-GUIDED  LEFT PSOAS ABSCESS ASPIRATION MEDICATIONS AND MEDICAL HISTORY: Versed 3 mg, Fentanyl 100 mcg. Additional Medications: NONE. ANESTHESIA/SEDATION: Moderate sedation time: 8 minutes PROCEDURE: The procedure, risks, benefits, and alternatives were explained to the patient. Questions regarding the procedure were encouraged and answered. The patient understands and consents to the procedure. The BACK was prepped with Betadine in a sterile fashion, and a sterile drape was applied covering the operative field. A sterile gown and sterile gloves were used for the procedure. Under CT guidance, an 18 gauge needle was advanced into the abscess anterior and to the left of the L2  vertebral body within the psoas muscle. 3 cc serosanguineous fluid was aspirated and sent for culture. Final imaging was performed. Patient tolerated the procedure well without complication. Vital sign monitoring by nursing staff during the procedure will continue as patient is in the special procedures unit for post procedure observation. FINDINGS: The images document guide needle placement within the left psoas abscess at L2. Post biopsy images demonstrate no hemorrhage. COMPLICATIONS: None IMPRESSION: Successful CT-guided aspiration of a left psoas abscess at L2. Electronically Signed   By: Jolaine Click M.D.   On: 11/04/2014 16:53    Scheduled Meds:  Scheduled Meds: . buPROPion  100 mg Oral BID  . cefTRIAXone (ROCEPHIN)  IV  2 g Intravenous Q24H  . diclofenac sodium  2 g Topical QID  . docusate sodium  100 mg Oral BID  . gabapentin  600 mg Oral TID  . methadone  10 mg Oral Daily  . methadone  20 mg Oral Q12H  . pantoprazole  40 mg Oral Daily  . rifampin  300 mg Oral BID  . sertraline  100 mg Oral Daily  . vancomycin  750 mg Intravenous Q8H   Continuous Infusions:   Time spent on care of this patient: 35 min   Donaciano Range, MD 11/06/2014, 12:15 PM  LOS: 2 days   [facility] Office  [phone] Pager - Text Page per [email or web address] If 7PM-7AM, please contact night-coverage [email or web address]

## 2014-11-06 NOTE — Plan of Care (Signed)
Problem: Consults Goal: Diagnosis - Spinal Surgery Outcome: Completed/Met Date Met:  11/06/14 Lumbar Laminectomy (Complex)

## 2014-11-06 NOTE — Progress Notes (Signed)
I looked at her MRI and I do not think there is any way I can drain the ventral epidural collection. I think the risk of that surgery would be high. I believe this is medical management with antibiotics as directed by infectious disease. Dr. Lovell SheehanJenkins to follow-up tomorrow.

## 2014-11-07 DIAGNOSIS — Z8619 Personal history of other infectious and parasitic diseases: Secondary | ICD-10-CM | POA: Insufficient documentation

## 2014-11-07 DIAGNOSIS — F1990 Other psychoactive substance use, unspecified, uncomplicated: Secondary | ICD-10-CM | POA: Insufficient documentation

## 2014-11-07 DIAGNOSIS — G062 Extradural and subdural abscess, unspecified: Secondary | ICD-10-CM | POA: Insufficient documentation

## 2014-11-07 LAB — VANCOMYCIN, TROUGH: Vancomycin Tr: 19 ug/mL (ref 10.0–20.0)

## 2014-11-07 LAB — MRSA PCR SCREENING: MRSA by PCR: NEGATIVE

## 2014-11-07 MED ORDER — VANCOMYCIN HCL IN DEXTROSE 750-5 MG/150ML-% IV SOLN: 750.0000 mg | Freq: Three times a day (TID) | INTRAVENOUS | Status: DC

## 2014-11-07 MED ORDER — ENOXAPARIN SODIUM 40 MG/0.4ML ~~LOC~~ SOLN
40.0000 mg | SUBCUTANEOUS | Status: DC
  Administered 2014-11-07 – 2014-11-08 (×2): 40 mg via SUBCUTANEOUS
  Filled 2014-11-07 (×2): qty 0.4

## 2014-11-07 MED ORDER — VANCOMYCIN HCL IN DEXTROSE 750-5 MG/150ML-% IV SOLN
750.0000 mg | Freq: Three times a day (TID) | INTRAVENOUS | Status: DC
  Administered 2014-11-07 – 2014-11-08 (×3): 750 mg via INTRAVENOUS
  Filled 2014-11-07 (×5): qty 150

## 2014-11-07 NOTE — Progress Notes (Signed)
[facility]    Subjective:  complaining of severe back pain   Antibiotics:  Anti-infectives    Start     Dose/Rate Route Frequency Ordered Stop   11/05/14 2000  vancomycin (VANCOCIN) IVPB 750 mg/150 ml premix     750 mg 150 mL/hr over 60 Minutes Intravenous Every 8 hours 11/05/14 1739     11/04/14 2200  rifampin (RIFADIN) capsule 300 mg     300 mg Oral 2 times daily 11/04/14 1655     11/04/14 1400  vancomycin (VANCOCIN) IVPB 1000 mg/200 mL premix  Status:  Discontinued     1,000 mg 200 mL/hr over 60 Minutes Intravenous Every 8 hours 11/04/14 0521 11/05/14 1739   11/04/14 1000  rifampin (RIFADIN) capsule 300 mg  Status:  Discontinued     300 mg Oral Daily 11/04/14 0827 11/04/14 1655   11/04/14 0900  cefTRIAXone (ROCEPHIN) 2 g in dextrose 5 % 50 mL IVPB     2 g 100 mL/hr over 30 Minutes Intravenous Every 24 hours 11/04/14 0826     11/04/14 0600  vancomycin (VANCOCIN) 1,500 mg in sodium chloride 0.9 % 500 mL IVPB     1,500 mg 250 mL/hr over 120 Minutes Intravenous  Once 11/04/14 0521 11/04/14 0746   11/04/14 0600  piperacillin-tazobactam (ZOSYN) IVPB 3.375 g  Status:  Discontinued     3.375 g 12.5 mL/hr over 240 Minutes Intravenous 3 times per day 11/04/14 0521 11/04/14 0941      Medications: Scheduled Meds: . buPROPion  100 mg Oral BID  . cefTRIAXone (ROCEPHIN)  IV  2 g Intravenous Q24H  . diclofenac sodium  2 g Topical QID  . docusate sodium  100 mg Oral BID  . enoxaparin (LOVENOX) injection  40 mg Subcutaneous Q24H  . gabapentin  600 mg Oral TID  . methadone  10 mg Oral Daily  . methadone  20 mg Oral Q12H  . pantoprazole  40 mg Oral Daily  . rifampin  300 mg Oral BID  . sertraline  100 mg Oral Daily  . vancomycin  750 mg Intravenous Q8H   Continuous Infusions:  PRN Meds:.cyclobenzaprine, HYDROmorphone (DILAUDID) injection, HYDROmorphone, ondansetron **OR** ondansetron (ZOFRAN) IV, promethazine, sodium  chloride    Objective: Weight change:  No intake or output data in the 24 hours ending 11/07/14 1449 Blood pressure 132/77, pulse 84, temperature 98.7 F (37.1 C), temperature source Oral, resp. rate 18, height 5\' 7"  (1.702 m), weight 230 lb (104.327 kg), SpO2 100 %. Temp:  [97.8 F (36.6 C)-98.7 F (37.1 C)] 98.7 F (37.1 C) (10/17 1109) Pulse Rate:  [70-93] 84 (10/17 1109) Resp:  [18-20] 18 (10/17 1109) BP: (132-149)/(65-81) 132/77 mmHg (10/17 1109) SpO2:  [96 %-100 %] 100 % (10/17 1109)  Physical Exam: General: Alert and awake, oriented x3, dysphoric. HEENT: anicteric sclera, pupils reactive to light and accommodation, EOMI CVS regular rate, normal r,  no murmur rubs or gallops Chest: clear to auscultation bilaterally, no wheezing, rales or rhonchi Abdomen: soft nontender, nondistended, normal bowel sounds, Extremities: 2+ edema Skin: no rashes  Neuro left side LE with weaker DF, flexion, extension, flexion knee, hip vs right  CBC: CBC Latest Ref Rng 11/04/2014 11/03/2014 10/06/2014  WBC 4.0 - 10.5 K/uL 4.5 5.6 11.4(H)  Hemoglobin 12.0 - 15.0 g/dL 10.9(L) 10.9(L) 11.2(L)  Hematocrit 36.0 - 46.0 % 34.3(L) 33.5(L) 34.2(L)  Platelets 150 - 400 K/uL 141(L) 144(L) 178  BMET No results for input(s): NA, K, CL, CO2, GLUCOSE, BUN, CREATININE, CALCIUM in the last 72 hours.   Liver Panel  No results for input(s): PROT, ALBUMIN, AST, ALT, ALKPHOS, BILITOT, BILIDIR, IBILI in the last 72 hours.     Sedimentation Rate No results for input(s): ESRSEDRATE in the last 72 hours. C-Reactive Protein No results for input(s): CRP in the last 72 hours.  Micro Results: Recent Results (from the past 720 hour(s))  MRSA PCR Screening     Status: None   Collection Time: 11/04/14  5:11 AM  Result Value Ref Range Status   MRSA by PCR NEGATIVE NEGATIVE Final    Comment:        The GeneXpert MRSA Assay (FDA approved for NASAL specimens only), is one component of  a comprehensive MRSA colonization surveillance program. It is not intended to diagnose MRSA infection nor to guide or monitor treatment for MRSA infections.   Culture, blood (routine x 2)     Status: None (Preliminary result)   Collection Time: 11/04/14  5:45 AM  Result Value Ref Range Status   Specimen Description BLOOD LEFT ANTECUBITAL  Final   Special Requests BOTTLES DRAWN AEROBIC AND ANAEROBIC 10CC  Final   Culture NO GROWTH 3 DAYS  Final   Report Status PENDING  Incomplete  Culture, blood (routine x 2)     Status: None (Preliminary result)   Collection Time: 11/04/14  6:00 AM  Result Value Ref Range Status   Specimen Description BLOOD BLOOD LEFT HAND  Final   Special Requests BOTTLES DRAWN AEROBIC AND ANAEROBIC 10CC  Final   Culture NO GROWTH 3 DAYS  Final   Report Status PENDING  Incomplete  Culture, routine-abscess     Status: None (Preliminary result)   Collection Time: 11/04/14  3:30 PM  Result Value Ref Range Status   Specimen Description ABSCESS  Final   Special Requests LEFT PSOAS  Final   Gram Stain   Final    FEW WBC PRESENT, PREDOMINANTLY PMN NO SQUAMOUS EPITHELIAL CELLS SEEN NO ORGANISMS SEEN Performed at [facility]    Culture   Final    NO GROWTH 2 DAYS Performed at [facility]    Report Status PENDING  Incomplete    Studies/Results: No results found.    Assessment/Plan:  INTERVAL HISTORY:   11/07/14: Patient seen by neurosurgery and not felt to be an appropriate case for neurosurgical intervention at this time   Principal Problem:   Spinal epidural abscess Active Problems:   Depression   Chronic back pain   GERD (gastroesophageal reflux disease)   Neuropathy (HCC)   Discitis of lumbar region   Spinal abscess (HCC)   Radicular pain of left lower extremity    Mackenzie Charles is a 54 y.o. female with IVDU hx , injuries and now with Worsening epidural abscess L1-L2 discitis despite being treated appropriately  with ceftriaxone for her Viridans streptococci isolated in September. She now has IR guided drainage with cultures not yet growing any new pathogens  #1 Discitis L1-L2 worsening with epidural abscess: It appears that her dose of vancomycin started on the 14th was started around the time that her abscess was aspirated. So far we have not isolated another pathogen but we'll continue on vancomycin and ceftriaxone and rifampin pending final susceptibility reports.  The patient will need to be retreated for another 6 weeks with IV antibiotics and will need to be in a skilled nursing facility. I have told her  that if she is leaving the skilled nursing facility this will lead to further suspicion that she is actively using IV drugs she claims not to be doing.  #2 IV drug use: She clearly needs to be in a treatment program she claims to have been clean for 2 years though I doubt this.  #3 history of hepatitis C that she states was treated 2 years ago. Check hep C viral load for thoroughness and check for HIV and hepatitis B     LOS: 3 days   Acey Lav 11/07/2014, 2:49 PM

## 2014-11-07 NOTE — Progress Notes (Signed)
ANTIBIOTIC CONSULT NOTE - FOLLOW UP  Pharmacy Consult for Vancomycin Indication: Spinal osteo and psoas abscess  No Known Allergies  Patient Measurements: Height:  (170.2 cm) Weight: 230 lb (104.327 kg) IBW/kg (Calculated) : 61.6  Vital Signs: Temp: 98 F (36.7 C) (10/17 1853) Temp Source: Oral (10/17 1853) BP: 132/62 mmHg (10/17 1853) Pulse Rate: 74 (10/17 1853) Intake/Output from previous day:   Intake/Output from this shift:    Labs: No results for input(s): WBC, HGB, PLT, LABCREA, CREATININE in the last 72 hours. Estimated Creatinine Clearance: 99.9 mL/min (by C-G formula based on Cr of 0.55).  Recent Labs  11/05/14 1645 11/07/14 1950  VANCOTROUGH 18 19     Microbiology: Recent Results (from the past 720 hour(s))  MRSA PCR Screening     Status: None   Collection Time: 11/04/14  5:11 AM  Result Value Ref Range Status   MRSA by PCR NEGATIVE NEGATIVE Final    Comment:        The GeneXpert MRSA Assay (FDA approved for NASAL specimens only), is one component of a comprehensive MRSA colonization surveillance program. It is not intended to diagnose MRSA infection nor to guide or monitor treatment for MRSA infections.   Culture, blood (routine x 2)     Status: None (Preliminary result)   Collection Time: 11/04/14  5:45 AM  Result Value Ref Range Status   Specimen Description BLOOD LEFT ANTECUBITAL  Final   Special Requests BOTTLES DRAWN AEROBIC AND ANAEROBIC 10CC  Final   Culture NO GROWTH 3 DAYS  Final   Report Status PENDING  Incomplete  Culture, blood (routine x 2)     Status: None (Preliminary result)   Collection Time: 11/04/14  6:00 AM  Result Value Ref Range Status   Specimen Description BLOOD BLOOD LEFT HAND  Final   Special Requests BOTTLES DRAWN AEROBIC AND ANAEROBIC 10CC  Final   Culture NO GROWTH 3 DAYS  Final   Report Status PENDING  Incomplete  Culture, routine-abscess     Status: None (Preliminary result)   Collection Time: 11/04/14   3:30 PM  Result Value Ref Range Status   Specimen Description ABSCESS  Final   Special Requests LEFT PSOAS  Final   Gram Stain   Final    FEW WBC PRESENT, PREDOMINANTLY PMN NO SQUAMOUS EPITHELIAL CELLS SEEN NO ORGANISMS SEEN Performed at [facility]    Culture   Final    NO GROWTH 2 DAYS Performed at [facility]    Report Status PENDING  Incomplete    Anti-infectives    Start     Dose/Rate Route Frequency Ordered Stop   11/05/14 2000  vancomycin (VANCOCIN) IVPB 750 mg/150 ml premix     750 mg 150 mL/hr over 60 Minutes Intravenous Every 8 hours 11/05/14 1739     11/04/14 2200  rifampin (RIFADIN) capsule 300 mg     300 mg Oral 2 times daily 11/04/14 1655     11/04/14 1400  vancomycin (VANCOCIN) IVPB 1000 mg/200 mL premix  Status:  Discontinued     1,000 mg 200 mL/hr over 60 Minutes Intravenous Every 8 hours 11/04/14 0521 11/05/14 1739   11/04/14 1000  rifampin (RIFADIN) capsule 300 mg  Status:  Discontinued     300 mg Oral Daily 11/04/14 0827 11/04/14 1655   11/04/14 0900  cefTRIAXone (ROCEPHIN) 2 g in dextrose 5 % 50 mL IVPB     2 g 100 mL/hr over 30 Minutes Intravenous Every 24 hours  11/04/14 0826     11/04/14 0600  vancomycin (VANCOCIN) 1,500 mg in sodium chloride 0.9 % 500 mL IVPB     1,500 mg 250 mL/hr over 120 Minutes Intravenous  Once 11/04/14 0521 11/04/14 0746   11/04/14 0600  piperacillin-tazobactam (ZOSYN) IVPB 3.375 g  Status:  Discontinued     3.375 g 12.5 mL/hr over 240 Minutes Intravenous 3 times per day 11/04/14 0521 11/04/14 0941      Assessment: 54 year old female with spinal osteomyelitis and psoas abscess s/p IR aspiration on vancomycin per pharmacy dosing and ceftriaxone.   VT today is therapeutic at 19. Level drawn appropriately but dose before given a little late so true trough might be slightly lower. SCr stable, CrCl ~13400ml/min.  Goal of Therapy:  Vancomycin trough level 15-20 mcg/ml  Resolution of infection  Plan:  Continue  vancomycin 750mg  IV Q8 Continue ceftriaxone 2g IV Q24 Monitor clinical picture, renal function, VT prn F/U C&S, abx deescalation / LOT  Enzo BiNathan Breiana Stratmann, PharmD Clinical Pharmacist Pager [phone] 11/07/2014 8:27 PM

## 2014-11-07 NOTE — Progress Notes (Signed)
[facility] Progress Note   Mackenzie Charles  WJX:[id number]  DOB: 1960/04/12  DOA: 11/03/2014 PCP: Pcp Not In System  Brief narrative: Mackenzie Charles is a 54 y.o. female with history of multiple back surgeries recently admitted for osteomyelitis/discitis, epidural abscess and psoas myositis. She underwent incision and drainage and was discharged to skilled nursing facility on Rocephin and rifampin to treat strep viridans which was found in her wound culture. She returns to the hospital for left leg pain that started last night. She followed up as outpatient with neurosurgery just yesterday. Neurosurgery has reevaluated her in the hospital and feels that at this time there are no indications for surgery and that antibiotics should be continued.   Subjective: Having pain in left lower abdomen today. No nausea, vomiting or constipation-had 2 normal bowel movements yesterday. Leg pain is controlled today. Continues to be able to ambulate.   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, osteomyelitis/discitis L1-L2, paraspinous abscesses, b/l psoas abscess/ myositis - Imaging further reveals severe canal stenosis at level of L1-L2 despite having decompressive laminectomy recently -Previously grew out strep viridans- have asked IR to aspirate left psoas abscess- culture negative thus far -IR unable to aspirate fluid collection and lumbar area as it is ventral to the spinal cord and draining will result in seeding of the CSF -ID consult requested-vancomycin added to Rocephin and rifampin-apparently there has been a suspicion at the skilled nursing facility that the patient has been using the PICC line for drug use-the patient denies it- UDS reveals Opiates and Benzo's which she has received prior to and during the hospital stay - NS recommending to continue antibiotics and repeating MRI in 1 month - mainly complains of pain in left buttock area  radiating to thigh which is controlled on medications- also noted to have numbness on left thigh -Continue methadone & Dilaudid  for pain management which she was taking prior to hospitalization--have increased Neurontin for radicular component to her pain - per RN, is ambulating to the bathroom without significant difficulty  Active Problems:   Depression --Continue Wellbutrin and Zoloft    Chronic back pain - on methadone as outpatient chronically     Code Status:     Code Status Orders        Start     Ordered   11/04/14 0454  Full code   Continuous     11/04/14 0453     Family Communication:  Disposition Plan: Likely will return to skilled nursing facility DVT prophylaxis: SCDs Consultants: ID, IR, neurosurgery Procedures:   Antibiotics: Anti-infectives    Start     Dose/Rate Route Frequency Ordered Stop   11/05/14 2000  vancomycin (VANCOCIN) IVPB 750 mg/150 ml premix     750 mg 150 mL/hr over 60 Minutes Intravenous Every 8 hours 11/05/14 1739     11/04/14 2200  rifampin (RIFADIN) capsule 300 mg     300 mg Oral 2 times daily 11/04/14 1655     11/04/14 1400  vancomycin (VANCOCIN) IVPB 1000 mg/200 mL premix  Status:  Discontinued     1,000 mg 200 mL/hr over 60 Minutes Intravenous Every 8 hours 11/04/14 0521 11/05/14 1739   11/04/14 1000  rifampin (RIFADIN) capsule 300 mg  Status:  Discontinued     300 mg Oral Daily 11/04/14 0827 11/04/14 1655   11/04/14 0900  cefTRIAXone (ROCEPHIN) 2 g in dextrose 5 % 50 mL IVPB     2 g 100 mL/hr over 30  Minutes Intravenous Every 24 hours 11/04/14 0826     11/04/14 0600  vancomycin (VANCOCIN) 1,500 mg in sodium chloride 0.9 % 500 mL IVPB     1,500 mg 250 mL/hr over 120 Minutes Intravenous  Once 11/04/14 0521 11/04/14 0746   11/04/14 0600  piperacillin-tazobactam (ZOSYN) IVPB 3.375 g  Status:  Discontinued     3.375 g 12.5 mL/hr over 240 Minutes Intravenous 3 times per day 11/04/14 0521 11/04/14 0941      Objective: Filed  Weights   11/03/14 2031  Weight: 104.327 kg (230 lb)   No intake or output data in the 24 hours ending 11/07/14 1228   Vitals Filed Vitals:   11/06/14 2215 11/07/14 0235 11/07/14 0500 11/07/14 1109  BP: 140/81 145/70 145/77 132/77  Pulse: 93 79 70 84  Temp: 98.1 F (36.7 C) 98 F (36.7 C) 97.8 F (36.6 C) 98.7 F (37.1 C)  TempSrc: Oral Oral Oral Oral  Resp: Height:      Weight:      SpO2: 96% 96% 100% 100%    Exam:  General:  Pt is alert, not in acute distress  HEENT: No icterus, No thrush, oral mucosa moist  Cardiovascular: regular rate and rhythm, S1/S2 No murmur  Respiratory: clear to auscultation bilaterally   Abdomen: Soft, +Bowel sounds, non tender, non distended, no guarding  MSK: No LE edema, cyanosis or clubbing- tender in left buttock and lateral left thigh, no tenderness noted in lumbar area- subjective numbness in left anterior thigh- can feel pain   Data Reviewed: Basic Metabolic Panel:  Recent Labs Lab 11/03/14 2102 11/04/14 0600  NA 136 135  K 3.9 4.1  CL 99* 99*  CO2 25 27  GLUCOSE 127* 103*  BUN 7 5*  CREATININE 0.63 0.55  CALCIUM 9.3 9.3   Liver Function Tests: No results for input(s): AST, ALT, ALKPHOS, BILITOT, PROT, ALBUMIN in the last 168 hours. No results for input(s): LIPASE, AMYLASE in the last 168 hours. No results for input(s): AMMONIA in the last 168 hours. CBC:  Recent Labs Lab 11/03/14 2102 11/04/14 0600  WBC 5.6 4.5  NEUTROABS 3.8  --   HGB 10.9* 10.9*  HCT 33.5* 34.3*  MCV 76.1* 76.4*  PLT 144* 141*   Cardiac Enzymes: No results for input(s): CKTOTAL, CKMB, CKMBINDEX, TROPONINI in the last 168 hours. BNP (last 3 results) No results for input(s): BNP in the last 8760 hours.  ProBNP (last 3 results) No results for input(s): PROBNP in the last 8760 hours.  CBG: No results for input(s): GLUCAP in the last 168 hours.  Recent Results (from the past 240 hour(s))  MRSA PCR Screening     Status:  None   Collection Time: 11/04/14  5:11 AM  Result Value Ref Range Status   MRSA by PCR NEGATIVE NEGATIVE Final    Comment:        The GeneXpert MRSA Assay (FDA approved for NASAL specimens only), is one component of a comprehensive MRSA colonization surveillance program. It is not intended to diagnose MRSA infection nor to guide or monitor treatment for MRSA infections.   Culture, blood (routine x 2)     Status: None (Preliminary result)   Collection Time: 11/04/14  5:45 AM  Result Value Ref Range Status   Specimen Description BLOOD LEFT ANTECUBITAL  Final   Special Requests BOTTLES DRAWN AEROBIC AND ANAEROBIC 10CC  Final   Culture NO GROWTH 2 DAYS  Final   Report Status  PENDING  Incomplete  Culture, blood (routine x 2)     Status: None (Preliminary result)   Collection Time: 11/04/14  6:00 AM  Result Value Ref Range Status   Specimen Description BLOOD BLOOD LEFT HAND  Final   Special Requests BOTTLES DRAWN AEROBIC AND ANAEROBIC 10CC  Final   Culture NO GROWTH 2 DAYS  Final   Report Status PENDING  Incomplete  Culture, routine-abscess     Status: None (Preliminary result)   Collection Time: 11/04/14  3:30 PM  Result Value Ref Range Status   Specimen Description ABSCESS  Final   Special Requests LEFT PSOAS  Final   Gram Stain   Final    FEW WBC PRESENT, PREDOMINANTLY PMN NO SQUAMOUS EPITHELIAL CELLS SEEN NO ORGANISMS SEEN Performed at [facility]    Culture   Final    NO GROWTH 2 DAYS Performed at [facility]    Report Status PENDING  Incomplete     Studies: No results found.  Scheduled Meds:  Scheduled Meds: . buPROPion  100 mg Oral BID  . cefTRIAXone (ROCEPHIN)  IV  2 g Intravenous Q24H  . diclofenac sodium  2 g Topical QID  . docusate sodium  100 mg Oral BID  . enoxaparin (LOVENOX) injection  40 mg Subcutaneous Q24H  . gabapentin  600 mg Oral TID  . methadone  10 mg Oral Daily  . methadone  20 mg Oral Q12H  . pantoprazole  40 mg Oral  Daily  . rifampin  300 mg Oral BID  . sertraline  100 mg Oral Daily  . vancomycin  750 mg Intravenous Q8H   Continuous Infusions:   Time spent on care of this patient: 35 min   Aleisha Paone, MD 11/07/2014, 12:28 PM  LOS: 3 days   [facility] Office  [phone] Pager - Text Page per [email or web address] If 7PM-7AM, please contact night-coverage [email or web address]

## 2014-11-08 DIAGNOSIS — G8929 Other chronic pain: Secondary | ICD-10-CM

## 2014-11-08 DIAGNOSIS — G061 Intraspinal abscess and granuloma: Principal | ICD-10-CM

## 2014-11-08 DIAGNOSIS — F329 Major depressive disorder, single episode, unspecified: Secondary | ICD-10-CM

## 2014-11-08 DIAGNOSIS — M549 Dorsalgia, unspecified: Secondary | ICD-10-CM

## 2014-11-08 DIAGNOSIS — G062 Extradural and subdural abscess, unspecified: Secondary | ICD-10-CM

## 2014-11-08 LAB — HIV ANTIBODY (ROUTINE TESTING W REFLEX): HIV Screen 4th Generation wRfx: NONREACTIVE

## 2014-11-08 LAB — CULTURE, ROUTINE-ABSCESS: CULTURE: NO GROWTH

## 2014-11-08 MED ORDER — RIFAMPIN 300 MG PO CAPS: 300.0000 mg | ORAL_CAPSULE | Freq: Two times a day (BID) | ORAL | Status: DC

## 2014-11-08 MED ORDER — DEXTROSE 5 % IV SOLN: 2.0000 g | INTRAVENOUS | Status: AC

## 2014-11-08 MED ORDER — DICLOFENAC SODIUM 1 % TD GEL: 2.0000 g | Freq: Four times a day (QID) | TRANSDERMAL | Status: AC

## 2014-11-08 MED ORDER — VANCOMYCIN HCL IN DEXTROSE 750-5 MG/150ML-% IV SOLN: 750.0000 mg | Freq: Three times a day (TID) | INTRAVENOUS | Status: AC

## 2014-11-08 MED ORDER — HYDROMORPHONE HCL 2 MG PO TABS: 2.0000 mg | ORAL_TABLET | ORAL | Status: AC | PRN

## 2014-11-08 MED ORDER — POLYETHYLENE GLYCOL 3350 17 G PO PACK: 17.0000 g | PACK | Freq: Two times a day (BID) | ORAL | Status: AC

## 2014-11-08 MED ORDER — SENNA 8.6 MG PO TABS: 2.0000 | ORAL_TABLET | Freq: Every day | ORAL | Status: AC

## 2014-11-08 MED ORDER — GABAPENTIN 300 MG PO CAPS: 600.0000 mg | ORAL_CAPSULE | Freq: Three times a day (TID) | ORAL | Status: AC

## 2014-11-08 MED ORDER — METHADONE HCL 10 MG PO TABS: 10.0000 mg | ORAL_TABLET | Freq: Three times a day (TID) | ORAL | Status: AC

## 2014-11-08 NOTE — Clinical Social Work Note (Signed)
Clinical Social Worker facilitated patient discharge including contacting patient family (patient stated she will contact daughter, Lane HackerHarley) and facility to confirm patient discharge plans.  Clinical information faxed to facility and family agreeable with plan.  CSW arranged ambulance transport via [facility] to [facility].  RN to call report prior to discharge.  DC packet prepared and on chart for transport with number for report.   Clinical Social Worker will sign off for now as social work intervention is no longer needed. Please consult us again if new need arises.  Derenda FennelBashira Kiante Ciavarella, MSW, LCSWA [phone] 11/08/2014 1:47 PM

## 2014-11-08 NOTE — Clinical Social Work Placement (Signed)
   CLINICAL SOCIAL WORK PLACEMENT  NOTE  Date:  11/08/2014  Patient Details  Name: Mackenzie Charles MRN: [id number] Date of Birth: 05-17-1960  Clinical Social Work is seeking post-discharge placement for this patient at the Skilled  Nursing Facility level of care (*CSW will initial, date and re-position this form in  chart as items are completed):  Yes   Patient/family provided with [facility] Clinical Social Work Department's list of facilities offering this level of care within the geographic area requested by the patient (or if unable, by the patient's family).  Yes   Patient/family informed of their freedom to choose among providers that offer the needed level of care, that participate in Medicare, Medicaid or managed care program needed by the patient, have an available bed and are willing to accept the patient.  Yes   Patient/family informed of [facility]'s ownership interest in [facility] and [facility], as well as of the fact that they are under no obligation to receive care at these facilities.  PASRR submitted to EDS on       PASRR number received on       Existing PASRR number confirmed on 11/08/14     FL2 transmitted to all facilities in geographic area requested by pt/family on 11/08/14     FL2 transmitted to all facilities within larger geographic area on       Patient informed that his/her managed care company has contracts with or will negotiate with certain facilities, including the following:        Yes   Patient/family informed of bed offers received.  Patient chooses bed at  [facility])     Physician recommends and patient chooses bed at      Patient to be transferred to  ([facility]) on 11/08/14.  Patient to be transferred to facility by  [facility] )     Patient family notified on 11/08/14 of transfer.  Name of family member notified:   (Pt reported she will contact dtr via telephone (Number  listed in chart is not a working number).)     PHYSICIAN Please prepare priority discharge summary, including medications     Additional Comment:    _______________________________________________ Derenda FennelBashira Laresa Oshiro, MSW, LCSWA [phone] 11/08/2014 1:47 PM

## 2014-11-08 NOTE — Clinical Social Work Note (Signed)
Clinical Social Worker has assessed patient at bedside. Full psychosocial assessment to follow.   [facility] will NOT accept patient back. CSW has completed FL-2 and faxed clinicals in new SNF search. CSW has also presented bed offers [facility]. Patient would like to discuss locations with daughter and meet with [facility] liaison before making final decision.  CSW remains available as needed.   Derenda FennelBashira Leandrea Ackley, MSW, LCSWA [phone] 11/08/2014 11:35 AM

## 2014-11-08 NOTE — Progress Notes (Signed)
Pt discharging at [facility] time [facility] via stretcher [facility] transport taking all personal belongings. Surgical site open to air, no drainage.  No noted distress. PICC in place to right upper arm site, clean dry and intact. Discharge paperwork along with prescriptions sent with pt to facility. Report called in to Debbie at facility.

## 2014-11-08 NOTE — Progress Notes (Signed)
[place] [facility] for Infectious Disease    Subjective:  complaining of severe back pain, numbness in feet   Antibiotics:  Anti-infectives    Start     Dose/Rate Route Frequency Ordered Stop   11/08/14 2100  vancomycin (VANCOCIN) IVPB 750 mg/150 ml premix  Status:  Discontinued     750 mg 150 mL/hr over 60 Minutes Intravenous Every 8 hours 11/07/14 2025 11/07/14 2031   11/07/14 2100  vancomycin (VANCOCIN) IVPB 750 mg/150 ml premix     750 mg 150 mL/hr over 60 Minutes Intravenous Every 8 hours 11/07/14 2031     11/05/14 2000  vancomycin (VANCOCIN) IVPB 750 mg/150 ml premix  Status:  Discontinued     750 mg 150 mL/hr over 60 Minutes Intravenous Every 8 hours 11/05/14 1739 11/07/14 2025   11/04/14 2200  rifampin (RIFADIN) capsule 300 mg     300 mg Oral 2 times daily 11/04/14 1655     11/04/14 1400  vancomycin (VANCOCIN) IVPB 1000 mg/200 mL premix  Status:  Discontinued     1,000 mg 200 mL/hr over 60 Minutes Intravenous Every 8 hours 11/04/14 0521 11/05/14 1739   11/04/14 1000  rifampin (RIFADIN) capsule 300 mg  Status:  Discontinued     300 mg Oral Daily 11/04/14 0827 11/04/14 1655   11/04/14 0900  cefTRIAXone (ROCEPHIN) 2 g in dextrose 5 % 50 mL IVPB     2 g 100 mL/hr over 30 Minutes Intravenous Every 24 hours 11/04/14 0826     11/04/14 0600  vancomycin (VANCOCIN) 1,500 mg in sodium chloride 0.9 % 500 mL IVPB     1,500 mg 250 mL/hr over 120 Minutes Intravenous  Once 11/04/14 0521 11/04/14 0746   11/04/14 0600  piperacillin-tazobactam (ZOSYN) IVPB 3.375 g  Status:  Discontinued     3.375 g 12.5 mL/hr over 240 Minutes Intravenous 3 times per day 11/04/14 0521 11/04/14 0941      Medications: Scheduled Meds: . buPROPion  100 mg Oral BID  . cefTRIAXone (ROCEPHIN)  IV  2 g Intravenous Q24H  . diclofenac sodium  2 g Topical QID  . docusate sodium  100 mg Oral BID  . enoxaparin (LOVENOX) injection  40 mg Subcutaneous Q24H  . gabapentin  600 mg Oral TID  .  methadone  10 mg Oral Daily  . methadone  20 mg Oral Q12H  . pantoprazole  40 mg Oral Daily  . rifampin  300 mg Oral BID  . sertraline  100 mg Oral Daily  . vancomycin  750 mg Intravenous Q8H   Continuous Infusions:  PRN Meds:.cyclobenzaprine, HYDROmorphone (DILAUDID) injection, HYDROmorphone, ondansetron **OR** ondansetron (ZOFRAN) IV, promethazine, sodium chloride    Objective: Weight change:  No intake or output data in the 24 hours ending 11/08/14 0928 Blood pressure 143/74, pulse 73, temperature 98.2 F (36.8 C), temperature source Oral, resp. rate 18, height 5' 7"  (1.702 m), weight 230 lb (104.327 kg), SpO2 99 %. Temp:  [97.9 F (36.6 C)-98.7 F (37.1 C)] 98.2 F (36.8 C) (10/18 0519) Pulse Rate:  [72-84] 73 (10/18 0519) Resp:  [18] 18 (10/18 0519) BP: (118-143)/(59-77) 143/74 mmHg (10/18 0519) SpO2:  [99 %-100 %] 99 % (10/18 0519)  Physical Exam: General: Alert and awake, oriented x3, dysphoric. HEENT: anicteric sclera, pupils reactive to light and accommodation, EOMI CVS regular rate, normal r,  no murmur rubs or gallops Chest: clear to auscultation bilaterally, no wheezing, rales or rhonchi Abdomen:  soft nontender, nondistended, normal bowel sounds, Extremities: 2+ edema Skin: no rashes  Neuro left side LE with weaker DF, flexion, extension, flexion knee, hip vs right  CBC: CBC Latest Ref Rng 11/04/2014 11/03/2014 10/06/2014  WBC 4.0 - 10.5 K/uL 4.5 5.6 11.4(H)  Hemoglobin 12.0 - 15.0 g/dL 10.9(L) 10.9(L) 11.2(L)  Hematocrit 36.0 - 46.0 % 34.3(L) 33.5(L) 34.2(L)  Platelets 150 - 400 K/uL 141(L) 144(L) 178       BMET No results for input(s): NA, K, CL, CO2, GLUCOSE, BUN, CREATININE, CALCIUM in the last 72 hours.   Liver Panel  No results for input(s): PROT, ALBUMIN, AST, ALT, ALKPHOS, BILITOT, BILIDIR, IBILI in the last 72 hours.     Sedimentation Rate No results for input(s): ESRSEDRATE in the last 72 hours. C-Reactive Protein No results for  input(s): CRP in the last 72 hours.  Micro Results: Recent Results (from the past 720 hour(s))  MRSA PCR Screening     Status: None   Collection Time: 11/04/14  5:11 AM  Result Value Ref Range Status   MRSA by PCR NEGATIVE NEGATIVE Final    Comment:        The GeneXpert MRSA Assay (FDA approved for NASAL specimens only), is one component of a comprehensive MRSA colonization surveillance program. It is not intended to diagnose MRSA infection nor to guide or monitor treatment for MRSA infections.   Culture, blood (routine x 2)     Status: None (Preliminary result)   Collection Time: 11/04/14  5:45 AM  Result Value Ref Range Status   Specimen Description BLOOD LEFT ANTECUBITAL  Final   Special Requests BOTTLES DRAWN AEROBIC AND ANAEROBIC 10CC  Final   Culture NO GROWTH 3 DAYS  Final   Report Status PENDING  Incomplete  Culture, blood (routine x 2)     Status: None (Preliminary result)   Collection Time: 11/04/14  6:00 AM  Result Value Ref Range Status   Specimen Description BLOOD BLOOD LEFT HAND  Final   Special Requests BOTTLES DRAWN AEROBIC AND ANAEROBIC 10CC  Final   Culture NO GROWTH 3 DAYS  Final   Report Status PENDING  Incomplete  Culture, routine-abscess     Status: None   Collection Time: 11/04/14  3:30 PM  Result Value Ref Range Status   Specimen Description ABSCESS  Final   Special Requests LEFT PSOAS  Final   Gram Stain   Final    FEW WBC PRESENT, PREDOMINANTLY PMN NO SQUAMOUS EPITHELIAL CELLS SEEN NO ORGANISMS SEEN Performed at [facility]    Culture   Final    NO GROWTH 3 DAYS Performed at [facility]    Report Status 11/08/2014 FINAL  Final  MRSA PCR Screening     Status: None   Collection Time: 11/07/14  8:01 PM  Result Value Ref Range Status   MRSA by PCR NEGATIVE NEGATIVE Final    Comment:        The GeneXpert MRSA Assay (FDA approved for NASAL specimens only), is one component of a comprehensive MRSA colonization surveillance  program. It is not intended to diagnose MRSA infection nor to guide or monitor treatment for MRSA infections.     Studies/Results: No results found.    Assessment/Plan:  INTERVAL HISTORY:   11/07/14: Patient seen by neurosurgery and not felt to be an appropriate case for neurosurgical intervention at this time  11/08/14: abscess cultures NG FINAL   Principal Problem:   Spinal epidural abscess Active Problems:   Depression  Chronic back pain   GERD (gastroesophageal reflux disease)   Neuropathy (HCC)   Discitis of lumbar region   Spinal abscess (HCC)   Radicular pain of left lower extremity   Epidural abscess   IVDU (intravenous drug user)   History of hepatitis C    Mackenzie Charles is a 54 y.o. female with IVDU hx , injuries and now with Worsening epidural abscess L1-L2 discitis despite being treated appropriately with ceftriaxone for her Viridans streptococci isolated in September. She now has IR guided drainage with cultures not yet growing any new pathogens  #1 Discitis L1-L2 worsening with epidural abscess I have RE-EXAMINED culture and timing of abx and SHE DID RECEIVE ONE GRAM VANCOMYCIN IN AM OF DAY SHE HAD ASPIRATE IN THE AFTERNOON, THIS COULD HVASE BEEN SUFFICIENT TO SUPPRESS AG COAG NEG STAPH  Therefore continue IV Vancomycin and rocephin with oral rifampin for MINIMUM 6 weeks possibly to 8 weeks   I have told her that if she is leaving the skilled nursing facility this will lead to further suspicion that she is actively using IV drugs she claims not to be doing.  Diagnosis: L1-L2 discitis and epidural abscess  Culture Result: NO GROWTH  No Known Allergies  Discharge antibiotics: IV rocephin 2 grams daily, IV vancomycin per pharmacy and oral rifampin Per pharmacy protocol Vancomycin Duration: 6 weeks to 8 weeks, needs to be seen by ID prior to stopping IV abx End Date: 12/15/14    [facility] Care Per Protocol: Labs weekly while on IV  antibiotics: x__ CBC with differential _x_ CMP _x_ CRP _x_ ESR x__ Vancomycin trough  Fax weekly labs to [phone]  Clinic Follow Up Appt:  In next 4 weeks I will arrange  #2 IV drug use: She clearly needs to be in a treatment program she claims to have been clean for 2 years though I doubt this.  #3 history of hepatitis C that she states was treated 2 years ago. Check hep C viral load for thoroughness and check for HIV and hepatitis B  I will sign off for now please call with further questions.   LOS: 4 days   Alcide Evener 11/08/2014, 9:28 AM

## 2014-11-08 NOTE — Clinical Social Work Note (Signed)
Clinical Social Work Assessment  Patient Details  Name: Mackenzie Charles MRN: [id number] Date of Birth: Feb 04, 1960  Date of referral:  11/08/14               Reason for consult:  Facility Placement, Discharge Planning                Permission sought to share information with:  Case Manager, Family Supports, Customer service manager Permission granted to share information::  Yes, Verbal Permission Granted  Name::      Actuary )  Agency::   (SNF's )  Relationship::   (Daughter )  Contact Information:   [phone])  Housing/Transportation Living arrangements for the past 2 months:  [place] of Information:  Patient Patient Interpreter Needed:  None Criminal Activity/Legal Involvement Pertinent to Current Situation/Hospitalization:  No - Comment as needed Significant Relationships:  Adult Children Lives with:  Self Do you feel safe going back to the place where you live?  Yes Need for family participation in patient care:  Yes (Comment)  Care giving concerns:  Patient requiring continued IV antibiotics at SNF (6-8weeks).   Social Worker assessment / plan:  Holiday representative met with patient at bedside in reference to post-acute placement for continued IV antibiotics. CSW introduced CSW role and SNF process. Patient admitted from [facility]. [facility] will NOT allow patient to return due to patient breaking rules at facility and possibly misusing PICC line. CSW has started new SNF search. Patient is agreeable to placement at [facility]. Pt has met with facility liaison. No further concerns reported at this time. CSW will continue to follow pt and pt's family for continued support and to facilitate pt's discharge needs once stable.   Employment status:  Disabled (Comment on whether or not currently receiving Disability) Insurance information:  Medicare, Medicaid In [place] PT Recommendations:  Not assessed at  this time Information / Referral to community resources:  [place]  Patient/Family's Response to care: Pt agreeable to SNF placement at [facility]. Pt pleasant and appreciated social work intervention.   Patient/Family's Understanding of and Emotional Response to Diagnosis, Current Treatment, and Prognosis:  Pt understanding that she was NOT accepted back at [facility] and will discharge to [facility] for 6-8 weeks of IV antibiotics.   Emotional Assessment Appearance:  Appears stated age Attitude/Demeanor/Rapport:   (Pleasant ) Affect (typically observed):  Accepting, Pleasant, Calm Orientation:  Oriented to Self, Oriented to Place, Oriented to  Time, Oriented to Situation Alcohol / Substance use:  Illicit Drugs, Tobacco Use Psych involvement (Current and /or in the community):  No (Comment)  Discharge Needs  Concerns to be addressed:  Care Coordination Readmission within the last 30 days:  No Current discharge risk:  Lives alone, Substance Abuse Barriers to Discharge:  Barriers Resolved   Glendon Axe, MSW, LCSWA [phone] 11/08/2014 3:33 PM

## 2014-11-08 NOTE — Discharge Summary (Signed)
PATIENT DETAILS Name: Mackenzie Charles Age: 55 y.o. Sex: female Date of Birth: March 23, 1960 MRN: [id number]. Admitting Physician: Edwin Dada, MD PCP:Pcp Not In System  Admit Date: 11/03/2014 Discharge date: 11/08/2014  Recommendations for Outpatient Follow-up:  1. Tentative Antibiotic end date 12/15/14-however needs to be seen by infectious disease prior to stopping IV antibiotics 2. Needs weekly CBC with differential,CMP, ESR, CRP, and vancomycin trough levels-fax labs to [phone] 3. Ensure follow-up with infectious disease and neurosurgery 4. Please ensure that vancomycin is dosed by pharmacy while at SNF. 5. Pending hep C viral load, HIV and hepatitis B serology-please follow 6. Please follow blood cultures and psoas muscle aspiration culture till final  PRIMARY DISCHARGE DIAGNOSIS:  Principal Problem:   Spinal epidural abscess Active Problems:   Depression   Chronic back pain   GERD (gastroesophageal reflux disease)   Neuropathy (HCC)   Discitis of lumbar region   Spinal abscess (HCC)   Radicular pain of left lower extremity   Epidural abscess   IVDU (intravenous drug user)   History of hepatitis C      PAST MEDICAL HISTORY: Past Medical History  Diagnosis Date  . Depression   . Chronic back pain   . GERD (gastroesophageal reflux disease)   . Chronic pain     DISCHARGE MEDICATIONS: Current Discharge Medication List    START taking these medications   Details  cefTRIAXone 2 g in dextrose 5 % 50 mL Inject 2 g into the vein daily. Stop date 12/15/14    diclofenac sodium (VOLTAREN) 1 % GEL Apply 2 g topically 4 (four) times daily.    polyethylene glycol (MIRALAX) packet Take 17 g by mouth 2 (two) times daily.    rifampin (RIFADIN) 300 MG capsule Take 1 capsule (300 mg total) by mouth 2 (two) times daily. Stop date 12/15/14    senna (SENOKOT) 8.6 MG TABS tablet Take 2 tablets (17.2 mg total) by mouth at bedtime.    Vancomycin  (VANCOCIN) 750 MG/150ML SOLN Inject 150 mLs (750 mg total) into the vein every 8 (eight) hours. Stop date 12/15/14 Qty: 4000 mL      CONTINUE these medications which have CHANGED   Details  gabapentin (NEURONTIN) 300 MG capsule Take 2 capsules (600 mg total) by mouth 3 (three) times daily.      CONTINUE these medications which have NOT CHANGED   Details  buPROPion (WELLBUTRIN) 100 MG tablet Take 100 mg by mouth 2 (two) times daily.    clotrimazole (LOTRIMIN) 1 % cream Apply 1 application topically 2 (two) times daily.    cyclobenzaprine (FLEXERIL) 10 MG tablet Take 10 mg by mouth 3 (three) times daily as needed for muscle spasms.    HYDROmorphone (DILAUDID) 2 MG tablet Take 2 mg by mouth every 4 (four) hours as needed for severe pain.    methadone (DOLOPHINE) 10 MG tablet Take 10-20 mg by mouth 3 (three) times daily. 20 mg at 8am, 10 pm at 2pm and 20 mg at 8pm    omeprazole (PRILOSEC) 40 MG capsule Take 40 mg by mouth daily.    promethazine (PHENERGAN) 25 MG tablet Take 25 mg by mouth every 6 (six) hours as needed for nausea or vomiting.    sertraline (ZOLOFT) 100 MG tablet Take 100 mg by mouth daily.      STOP taking these medications     HYDROcodone-acetaminophen (NORCO/VICODIN) 5-325 MG tablet         ALLERGIES:  No Known Allergies  BRIEF HPI:  See H&P, Labs, Consult and Test reports for all details in brief, patient  is a 54 y.o. female with history of multiple back surgeries recently admitted for osteomyelitis/discitis, epidural abscess and psoas myositis. She underwent incision and drainage and was discharged to skilled nursing facility on Rocephin and rifampin to treat strep viridans which was found in her wound culture. She returns to the hospital for left leg pain that started last night  CONSULTATIONS:   ID and Neurosurgery  PERTINENT RADIOLOGIC STUDIES: Mr Lumbar Spine W Wo Contrast  11/04/2014  CLINICAL DATA:  Initial evaluation for severe left leg pain,  increasing with movement. Constant numbness in left leg. History of back surgery. EXAM: MRI LUMBAR SPINE WITHOUT AND WITH CONTRAST TECHNIQUE: Multiplanar and multiecho pulse sequences of the lumbar spine were obtained without and with intravenous contrast. CONTRAST:  47m MULTIHANCE GADOBENATE DIMEGLUMINE 529 MG/ML IV SOLN COMPARISON:  Prior MRI from 10/07/2014. FINDINGS: Study is mildly degraded by motion artifact. Patient is status post posterior instrumentation at L2 through S1. Alignment is stable with grade 1 anterolisthesis of L5 on S1. Enhancement about the interbody graft at L5-S1 again seen. Vertebral body heights are maintained. Postoperative changes from interval performance of decompressive laminectomy at the L1 and L2 levels. Postoperative collection at the laminectomy site measures 2.9 x 2.4 x 1.9 cm. No significant enhancement about this collection, favoring postoperative seroma. There is superficial extension along the incisional site with small amount of fluid along the incisional wound (series 6, image 4). There is persistent abnormal T1 hypo intense, T2/STIR hyperintense signal intensity within the L1 and L2 vertebral bodies with postcontrast enhancement, concerning for persistent osteomyelitis discitis. There is edema with enhancement within the adjacent psoas musculature, suggesting associated myositis. Irregular T2 hyperintense rim enhancing collections now seen at the left anterior margin of the superior aspect of L2, measuring 2.6 x 1.6 cm, concerning for paraspinous abscess (series 6, image 14). Additional more confluent T2 hyperintense collection more inferiorly within the left psoas muscle of the level of L4 measures 2.3 x 1.0 cm (series 6, image 28), also suspicious for abscess. There is a 4.4 x 1.7 x 2.3 cm collection within the ventral epidural space centered at the level of L1-2, but spanning from L1 through the superior aspect of L3. Associated irregular enhancement on post-contrast  sequences. Findings concerning for recurrent epidural abscess. Abnormal enhancement throughout the epidural space seen at the L1-2 level, which main in large part be related to prior surgery. There is fairly severe canal stenosis at the level of L1-2 with the thecal sac measuring 10 mm in AP diameter at its most narrow point. Additional multilevel degenerative changes are grossly similar. Marked distension of the bladder noted. IMPRESSION: 1. 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 Resultant severe canal stenosis at the level of L1-2 despite interval performance of L1/L2 decompressive laminectomy. 2. 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 3. Status post L2 through S1 PLIF with interval performance of additional L1 and L2 decompressive laminectomy. Postoperative collection at the L1-2 laminectomy site as above. 4. Grade 1 anterolisthesis of L5 on S1, stable. Critical Value/emergent results were called by telephone at the time of interpretation on 11/04/2014 at 2:01 am to [place] who verbally acknowledged these results. Electronically Signed   By: BPincus BadderD.  On: 11/04/2014 02:02   Ct Aspiration  11/04/2014  CLINICAL DATA:  Discitis at L1-2 EXAM: CT-GUIDED  LEFT PSOAS ABSCESS ASPIRATION MEDICATIONS AND MEDICAL HISTORY: Versed 3 mg, Fentanyl 100 mcg. Additional Medications: NONE. ANESTHESIA/SEDATION: Moderate sedation time: 8 minutes PROCEDURE: The procedure, risks, benefits, and alternatives were explained to the patient. Questions regarding the procedure were encouraged and answered. The patient understands and consents to the procedure. The BACK was prepped with Betadine in a sterile fashion, and a sterile drape was applied covering the operative field. A sterile gown and sterile gloves  were used for the procedure. Under CT guidance, an 18 gauge needle was advanced into the abscess anterior and to the left of the L2 vertebral body within the psoas muscle. 3 cc serosanguineous fluid was aspirated and sent for culture. Final imaging was performed. Patient tolerated the procedure well without complication. Vital sign monitoring by nursing staff during the procedure will continue as patient is in the special procedures unit for post procedure observation. FINDINGS: The images document guide needle placement within the left psoas abscess at L2. Post biopsy images demonstrate no hemorrhage. COMPLICATIONS: None IMPRESSION: Successful CT-guided aspiration of a left psoas abscess at L2. Electronically Signed   By: Marybelle Killings M.D.   On: 11/04/2014 16:53     PERTINENT LAB RESULTS: CBC: No results for input(s): WBC, HGB, HCT, PLT in the last 72 hours. CMET CMP     Component Value Date/Time   NA 135 11/04/2014 0600   K 4.1 11/04/2014 0600   CL 99* 11/04/2014 0600   CO2 27 11/04/2014 0600   GLUCOSE 103* 11/04/2014 0600   BUN 5* 11/04/2014 0600   CREATININE 0.55 11/04/2014 0600   CALCIUM 9.3 11/04/2014 0600   GFRNONAA >60 11/04/2014 0600   GFRAA >60 11/04/2014 0600    GFR Estimated Creatinine Clearance: 99.9 mL/min (by C-G formula based on Cr of 0.55). No results for input(s): LIPASE, AMYLASE in the last 72 hours. No results for input(s): CKTOTAL, CKMB, CKMBINDEX, TROPONINI in the last 72 hours. Invalid input(s): POCBNP No results for input(s): DDIMER in the last 72 hours. No results for input(s): HGBA1C in the last 72 hours. No results for input(s): CHOL, HDL, LDLCALC, TRIG, CHOLHDL, LDLDIRECT in the last 72 hours. No results for input(s): TSH, T4TOTAL, T3FREE, THYROIDAB in the last 72 hours.  Invalid input(s): FREET3 No results for input(s): VITAMINB12, FOLATE, FERRITIN, TIBC, IRON, RETICCTPCT in the last 72 hours. Coags: No results for input(s): INR in the last 72  hours.  Invalid input(s): PT Microbiology: Recent Results (from the past 240 hour(s))  MRSA PCR Screening     Status: None   Collection Time: 11/04/14  5:11 AM  Result Value Ref Range Status   MRSA by PCR NEGATIVE NEGATIVE Final    Comment:        The GeneXpert MRSA Assay (FDA approved for NASAL specimens only), is one component of a comprehensive MRSA colonization surveillance program. It is not intended to diagnose MRSA infection nor to guide or monitor treatment for MRSA infections.   Culture, blood (routine x 2)     Status: None (Preliminary result)   Collection Time: 11/04/14  5:45 AM  Result Value Ref Range Status   Specimen Description BLOOD LEFT ANTECUBITAL  Final   Special Requests BOTTLES DRAWN AEROBIC AND ANAEROBIC 10CC  Final   Culture NO GROWTH 3 DAYS  Final   Report Status PENDING  Incomplete  Culture, blood (routine x 2)  Status: None (Preliminary result)   Collection Time: 11/04/14  6:00 AM  Result Value Ref Range Status   Specimen Description BLOOD BLOOD LEFT HAND  Final   Special Requests BOTTLES DRAWN AEROBIC AND ANAEROBIC 10CC  Final   Culture NO GROWTH 3 DAYS  Final   Report Status PENDING  Incomplete  Culture, routine-abscess     Status: None   Collection Time: 11/04/14  3:30 PM  Result Value Ref Range Status   Specimen Description ABSCESS  Final   Special Requests LEFT PSOAS  Final   Gram Stain   Final    FEW WBC PRESENT, PREDOMINANTLY PMN NO SQUAMOUS EPITHELIAL CELLS SEEN NO ORGANISMS SEEN Performed at [facility]    Culture   Final    NO GROWTH 3 DAYS Performed at [facility]    Report Status 11/08/2014 FINAL  Final  MRSA PCR Screening     Status: None   Collection Time: 11/07/14  8:01 PM  Result Value Ref Range Status   MRSA by PCR NEGATIVE NEGATIVE Final    Comment:        The GeneXpert MRSA Assay (FDA approved for NASAL specimens only), is one component of a comprehensive MRSA colonization surveillance  program. It is not intended to diagnose MRSA infection nor to guide or monitor treatment for MRSA infections.      BRIEF HOSPITAL COURSE:  Brief narrative: Mackenzie Charles is a 54 y.o. female with history of multiple back surgeries recently admitted for osteomyelitis/discitis, epidural abscess and psoas myositis. She underwent incision and drainage and was discharged to skilled nursing facility on Rocephin and rifampin to treat strep viridans which was found in her wound culture. She returned to the hospital for left leg pain that started last night. She followed up as outpatient with neurosurgery just one day prior to this admission Neurosurgery has reevaluated her in the hospital and feels that at this time there are no indications for surgery and that antibiotics should be continued. IR performed left psoas abscess aspiration on 10/14-cultures continue to be negative so far. Patient was seen by infectious disease, current recommendations are to continue IV vancomycin, Rocephin and oral rifampin. 12/15/14. Please see below for further details  Hospital course by problem list: Spinal epidural abscess from L1-L3, osteomyelitis/discitis L1-L2, paraspinous abscesses, b/l psoas abscess/ myositis: Previous cultures prior to this admission positive for Streptococcus viridans. Repeat imaging with MRI of the lumbar spine showed persistent osteomyelitis/discitis at L1-L2 with a [phone] cm recurrent ventral epidural abscess. It also showed myositis in the bilateral psoas musculature with paraspinous abscesses. Patient was evaluated by neurosurgery, who felt that the ventral epidural collection could not be drained and the risks for surgery in this instance was too high. Interventional radiology was consulted, patient underwent psoas muscle abscess aspiration on 10/14-cultures so far negative. Patient was also seen by infectious disease, this M.D. spoke with Dr. Lucianne Lei dam today-recommendations are  for intravenous vancomycin, intravenous Rocephin and oral rifampin for 6-8 weeks-tentative stop date for antibiotics 12/15/14. However antibiotics needs to be stopped only after seeing infectious disease. Please ensure that vancomycin is dosed by pharmacy while at SNF. Patient will need weekly labs-as outlined above to be faxed to the infectious disease clinic.   Principal Problem: Chronic back pain: Continue methadone, and Dilaudid for breakthrough. Placed on bowel regimen with MiraLAX and Senokot  Depression: Continue Wellbutrin and Zoloft.  History of hepatitis C: states was treated 2 years ago. Pending hep C viral load, HIV  and hepatitis B serology-please follow  History of IV drug use: Claims she has never misused her PICC line while at SNF. Urine drug screen positive for benzos and opiates-suspect she was already on these medications. Patient is aware that she needs to continue to not use any IV drug use-explained life-threatening and life disabling risks of bacteremia/abscesses/vertebral osteomyelitis. Patient also aware that current infection is best treated with IV antibiotics-and that she will need to go to a skilled nursing facility to complete course of antibiotics.  TODAY-DAY OF DISCHARGE:  Subjective:   Mackenzie Charles today has no headache,no chest abdominal pain,no new weakness tingling or numbness. Her back pain is essentially unchanged. No focal deficits on exam  Objective:   Blood pressure 147/83, pulse 76, temperature 97.7 F (36.5 C), temperature source Axillary, resp. rate 20, height 5' 7"  (1.702 m), weight 104.327 kg (230 lb), SpO2 100 %. No intake or output data in the 24 hours ending 11/08/14 1049 Filed Weights   11/03/14 2031  Weight: 104.327 kg (230 lb)    Exam Awake Alert, Oriented *3, No new F.N deficits, Normal affect Mackenzie Charles.AT,PERRAL Supple Neck,No JVD, No cervical lymphadenopathy appriciated.  Symmetrical Chest wall movement, Good air movement  bilaterally, CTAB RRR,No Gallops,Rubs or new Murmurs, No Parasternal Heave +ve B.Sounds, Abd Soft, Non tender, No organomegaly appriciated, No rebound -guarding or rigidity. No Cyanosis, Clubbing or edema, No new Rash or bruise  DISCHARGE CONDITION: Stable  DISPOSITION: SNF  DISCHARGE INSTRUCTIONS:    Activity:  As tolerated with Full fall precautions use walker/cane & assistance as needed  Get Medicines reviewed and adjusted: Please take all your medications with you for your next visit with your Primary MD  Please request your Primary MD to go over all hospital tests and procedure/radiological results at the follow up, please ask your Primary MD to get all Hospital records sent to his/her office.  If you experience worsening of your admission symptoms, develop shortness of breath, life threatening emergency, suicidal or homicidal thoughts you must seek medical attention immediately by calling 911 or calling your MD immediately  if symptoms less severe.  You must read complete instructions/literature along with all the possible adverse reactions/side effects for all the Medicines you take and that have been prescribed to you. Take any new Medicines after you have completely understood and accpet all the possible adverse reactions/side effects.   Do not drive when taking Pain medications.   Do not take more than prescribed Pain, Sleep and Anxiety Medications  Special Instructions: If you have smoked or chewed Tobacco  in the last 2 yrs please stop smoking, stop any regular Alcohol  and or any Recreational drug use.  Wear Seat belts while driving.  Please note  You were cared for by a hospitalist during your hospital stay. Once you are discharged, your primary care physician will handle any further medical issues. Please note that NO REFILLS for any discharge medications will be authorized once you are discharged, as it is imperative that you return to your primary care physician (or  establish a relationship with a primary care physician if you do not have one) for your aftercare needs so that they can reassess your need for medications and monitor your lab values.  Diet recommendation: Regular Diet  Discharge Instructions    Call MD for:  redness, tenderness, or signs of infection (pain, swelling, redness, odor or green/yellow discharge around incision site)    Complete by:  As directed      Call  MD for:  severe uncontrolled pain    Complete by:  As directed      Call MD for:  temperature >100.4    Complete by:  As directed      Diet general    Complete by:  As directed      Increase activity slowly    Complete by:  As directed            Follow-up Information    Follow up with Ophelia Charter, MD. Schedule an appointment as soon as possible for a visit in 2 weeks.   Specialty:  Neurosurgery   Contact information:   [address] [place] [address] [place] [address] [phone]       Follow up with Bobby Rumpf, MD On 11/23/2014.   Specialty:  Infectious Diseases   Why:  Hospital follow up-appoiontment at 3 pm   Contact information:   [address] [place] [address] [phone]       Total Time spent on discharge equals  45 minutes.  SignedOren Binet 11/08/2014 10:49 AM

## 2014-11-09 LAB — HCV RNA QUANT RFLX ULTRA OR GENOTYP
HCV RNA Qnt(log copy/mL): UNDETERMINED log10 IU/mL
HepC Qn: NOT DETECTED IU/mL

## 2014-11-09 LAB — CULTURE, BLOOD (ROUTINE X 2)
Culture: NO GROWTH
Culture: NO GROWTH

## 2014-11-09 LAB — HEPATITIS B SURFACE ANTIBODY, QUANTITATIVE: Hepatitis B-Post: <3.1 m[IU]/mL — ABNORMAL LOW (ref >9.9)

## 2014-11-09 LAB — HEPATITIS B SURFACE ANTIGEN: Hepatitis B Surface Ag: NEGATIVE

## 2014-11-10 ENCOUNTER — Non-Acute Institutional Stay (SKILLED_NURSING_FACILITY): Payer: Medicare Other | Admitting: Internal Medicine

## 2014-11-10 ENCOUNTER — Encounter: Payer: Self-pay | Admitting: Internal Medicine

## 2014-11-10 DIAGNOSIS — F1990 Other psychoactive substance use, unspecified, uncomplicated: Secondary | ICD-10-CM | POA: Diagnosis not present

## 2014-11-10 DIAGNOSIS — M4646 Discitis, unspecified, lumbar region: Secondary | ICD-10-CM

## 2014-11-10 DIAGNOSIS — F32A Depression, unspecified: Secondary | ICD-10-CM

## 2014-11-10 DIAGNOSIS — G061 Intraspinal abscess and granuloma: Secondary | ICD-10-CM | POA: Diagnosis not present

## 2014-11-10 DIAGNOSIS — F329 Major depressive disorder, single episode, unspecified: Secondary | ICD-10-CM | POA: Diagnosis not present

## 2014-11-10 DIAGNOSIS — G8929 Other chronic pain: Secondary | ICD-10-CM

## 2014-11-10 DIAGNOSIS — M5410 Radiculopathy, site unspecified: Secondary | ICD-10-CM

## 2014-11-10 DIAGNOSIS — M4620 Osteomyelitis of vertebra, site unspecified: Secondary | ICD-10-CM | POA: Diagnosis not present

## 2014-11-10 DIAGNOSIS — Z8619 Personal history of other infectious and parasitic diseases: Secondary | ICD-10-CM

## 2014-11-10 NOTE — Progress Notes (Signed)
Patient ID: Mackenzie Charles, female   DOB: 1961-01-13, 54 y.o.   MRN: [id number]    HISTORY AND PHYSICAL   DATE: 11/10/14  Location:  [place] of Service: SNF [id number])   Extended Emergency Contact Information Primary Emergency Contact: [facility] Address: [address]          [place],  [place] Home Phone: [phone] Relation: None Secondary Emergency Contact: [place] Mobile Phone: [phone] Relation: Daughter  Advanced Directive information  FULL CODE  Chief Complaint  Patient presents with  . New Admit To SNF    HPI:  54 yo female seen today as a new admission into SNF following hospital stay for spinal epidural abscess, left psoas abscess, lumbar discitis, LLE radicular pain, chronic back pain, hx IVDA, hx hepatitis C, depression. 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 She was started on IV and po abx and will take them thru 11/24th. ID consulted and followed pt. She was sent to SNF for short term rehab  She has no c/o today. No nursing issues. No falls. Tolerating PT. Appetite okay. She is taking IC Vanco and rocephin via right arm PICC line as well as po rifampin  Hx Hep C/ IVDA hx - she completed Hep C tx. Hep C viral load obtained in hospital revealed no HCV and therefore could not be calculated. Hep B Ag neg  Depression - mood stable on wellbutrin, zoloft  GERD - stable on omeprazole. She has intermittent N/V and takes prn promethazine. Constipation stable on miralax and senna  LLE radicular pain/neuropathy/chronic pain syndrome - stable on dilaudid, flexeril, gabapentin and methadone. She also uses voltaren gel  Past Medical History  Diagnosis Date  . Depression   . Chronic back pain   . GERD (gastroesophageal reflux disease)   . Chronic pain     Past Surgical History  Procedure Laterality Date  . Cervical fusion    . Lumbar  laminectomy/decompression microdiscectomy  10/07/2014    Procedure: LUMBAR LAMINECTOMY  Lumbar one - two;  Surgeon: Newman Pies, MD;  Location: [facility] [facility];  Service: Neurosurgery;;    Patient Care Team: Pcp Not In System as PCP - General  Social History   Social History  . Marital Status: Legally Separated    Spouse Name: N/A  . Number of Children: N/A  . Years of Education: N/A   Occupational History  . Not on file.   Social History Main Topics  . Smoking status: Current Every Day Smoker -- 0.30 packs/day    Types: Cigarettes  . Smokeless tobacco: Not on file  . Alcohol Use: Not on file  . Drug Use: Not on file  . Sexual Activity: Not on file   Other Topics Concern  . Not on file   Social History Narrative     reports that she has been smoking Cigarettes.  She has been smoking about 0.30 packs per day. She does not have any smokeless tobacco history on file. Her alcohol and drug histories are not on file.  Family History  Problem Relation Age of Onset  . Drug abuse Other    No family status information on file.     There is no immunization history on file for this patient.  No Known Allergies  Medications: Patient's Medications  New Prescriptions   No medications on file  Previous Medications   BUPROPION (WELLBUTRIN) 100 MG TABLET    Take  100 mg by mouth 2 (two) times daily.   CEFTRIAXONE 2 G IN DEXTROSE 5 % 50 ML    Inject 2 g into the vein daily. Stop date 12/15/14   CLOTRIMAZOLE (LOTRIMIN) 1 % CREAM    Apply 1 application topically 2 (two) times daily.   CYCLOBENZAPRINE (FLEXERIL) 10 MG TABLET    Take 10 mg by mouth 3 (three) times daily as needed for muscle spasms.   DICLOFENAC SODIUM (VOLTAREN) 1 % GEL    Apply 2 g topically 4 (four) times daily.   GABAPENTIN (NEURONTIN) 300 MG CAPSULE    Take 2 capsules (600 mg total) by mouth 3 (three) times daily.   HYDROMORPHONE (DILAUDID) 2 MG TABLET    Take 1 tablet (2 mg total) by mouth every 4 (four) hours  as needed for severe pain.   METHADONE (DOLOPHINE) 10 MG TABLET    Take 1-2 tablets (10-20 mg total) by mouth 3 (three) times daily. 20 mg at 8am, 10 pm at 2pm and 20 mg at 8pm   OMEPRAZOLE (PRILOSEC) 40 MG CAPSULE    Take 40 mg by mouth daily.   POLYETHYLENE GLYCOL (MIRALAX) PACKET    Take 17 g by mouth 2 (two) times daily.   PROMETHAZINE (PHENERGAN) 25 MG TABLET    Take 25 mg by mouth every 6 (six) hours as needed for nausea or vomiting.   RIFAMPIN (RIFADIN) 300 MG CAPSULE    Take 1 capsule (300 mg total) by mouth 2 (two) times daily. Stop date 12/15/14   SENNA (SENOKOT) 8.6 MG TABS TABLET    Take 2 tablets (17.2 mg total) by mouth at bedtime.   SERTRALINE (ZOLOFT) 100 MG TABLET    Take 100 mg by mouth daily.   VANCOMYCIN (VANCOCIN) 750 MG/150ML SOLN    Inject 150 mLs (750 mg total) into the vein every 8 (eight) hours. Stop date 12/15/14  Modified Medications   No medications on file  Discontinued Medications   No medications on file    Review of Systems  Musculoskeletal: Positive for back pain, arthralgias and gait problem.  Neurological: Positive for weakness.  All other systems reviewed and are negative.   Filed Vitals:   11/10/14 2212  BP: 130/70  Pulse: 74  Temp: 97.8 F (36.6 C)  Weight: 238 lb 14.4 oz (108.364 kg)   Body mass index is 37.41 kg/(m^2).  Physical Exam  Constitutional: She is oriented to person, place, and time. She appears well-developed.  Sitting in bed in NAD, frail appearing  HENT:  Mouth/Throat: Oropharynx is clear and moist. No oropharyngeal exudate.  Eyes: Pupils are equal, round, and reactive to light. No scleral icterus.  Neck: Neck supple. Carotid bruit is not present. No tracheal deviation present. No thyromegaly present.  Cardiovascular: Regular rhythm, normal heart sounds and intact distal pulses.  Tachycardia present.  Exam reveals no gallop and no friction rub.   No murmur heard. Right arm PICC line intact with no redness at insertion site.  No LE edema b/l. No calf TTP  Pulmonary/Chest: Effort normal and breath sounds normal. No stridor. No respiratory distress. She has no wheezes. She has no rales.  Abdominal: Soft. Bowel sounds are normal. She exhibits no distension and no mass. There is no hepatomegaly. There is no tenderness. There is no rebound and no guarding.  Musculoskeletal: She exhibits edema and tenderness (b/l lumbar spine).  Lymphadenopathy:    She has no cervical adenopathy.  Neurological: She is alert and oriented to person, place,  and time.  Skin: Skin is warm and dry. No rash noted.  Psychiatric: She has a normal mood and affect. Her behavior is normal. Judgment and thought content normal.     Labs reviewed: Admission on 11/03/2014, Discharged on 11/08/2014  Component Date Value Ref Range Status  . WBC 11/03/2014 5.6  4.0 - 10.5 K/uL Final  . RBC 11/03/2014 4.40  3.87 - 5.11 MIL/uL Final  . Hemoglobin 11/03/2014 10.9* 12.0 - 15.0 g/dL Final  . HCT 11/03/2014 33.5* 36.0 - 46.0 % Final  . MCV 11/03/2014 76.1* 78.0 - 100.0 fL Final  . MCH 11/03/2014 24.8* 26.0 - 34.0 pg Final  . MCHC 11/03/2014 32.5  30.0 - 36.0 g/dL Final  . RDW 11/03/2014 14.7  11.5 - 15.5 % Final  . Platelets 11/03/2014 144* 150 - 400 K/uL Final  . Neutrophils Relative % 11/03/2014 68   Final  . Neutro Abs 11/03/2014 3.8  1.7 - 7.7 K/uL Final  . Lymphocytes Relative 11/03/2014 19   Final  . Lymphs Abs 11/03/2014 1.1  0.7 - 4.0 K/uL Final  . Monocytes Relative 11/03/2014 9   Final  . Monocytes Absolute 11/03/2014 0.5  0.1 - 1.0 K/uL Final  . Eosinophils Relative 11/03/2014 4   Final  . Eosinophils Absolute 11/03/2014 0.2  0.0 - 0.7 K/uL Final  . Basophils Relative 11/03/2014 0   Final  . Basophils Absolute 11/03/2014 0.0  0.0 - 0.1 K/uL Final  . Sodium 11/03/2014 136  135 - 145 mmol/L Final  . Potassium 11/03/2014 3.9  3.5 - 5.1 mmol/L Final  . Chloride 11/03/2014 99* 101 - 111 mmol/L Final  . CO2 11/03/2014 25  22 - 32 mmol/L Final    . Glucose, Bld 11/03/2014 127* 65 - 99 mg/dL Final  . BUN 11/03/2014 7  6 - 20 mg/dL Final  . Creatinine, Ser 11/03/2014 0.63  0.44 - 1.00 mg/dL Final  . Calcium 11/03/2014 9.3  8.9 - 10.3 mg/dL Final  . GFR calc non Af Amer 11/03/2014 >60  >60 mL/min Final  . GFR calc Af Amer 11/03/2014 >60  >60 mL/min Final   Comment: (NOTE) The eGFR has been calculated using the CKD EPI equation. This calculation has not been validated in all clinical situations. eGFR's persistently <60 mL/min signify possible Chronic Kidney Disease.   . Anion gap 11/03/2014 12  5 - 15 Final  . Specimen Description 11/04/2014 BLOOD LEFT ANTECUBITAL   Final  . Special Requests 11/04/2014 BOTTLES DRAWN AEROBIC AND ANAEROBIC 10CC   Final  . Culture 11/04/2014 NO GROWTH 5 DAYS   Final  . Report Status 11/04/2014 11/09/2014 FINAL   Final  . Specimen Description 11/04/2014 BLOOD BLOOD LEFT HAND   Final  . Special Requests 11/04/2014 BOTTLES DRAWN AEROBIC AND ANAEROBIC 10CC   Final  . Culture 11/04/2014 NO GROWTH 5 DAYS   Final  . Report Status 11/04/2014 11/09/2014 FINAL   Final  . Sodium 11/04/2014 135  135 - 145 mmol/L Final  . Potassium 11/04/2014 4.1  3.5 - 5.1 mmol/L Final  . Chloride 11/04/2014 99* 101 - 111 mmol/L Final  . CO2 11/04/2014 27  22 - 32 mmol/L Final  . Glucose, Bld 11/04/2014 103* 65 - 99 mg/dL Final  . BUN 11/04/2014 5* 6 - 20 mg/dL Final  . Creatinine, Ser 11/04/2014 0.55  0.44 - 1.00 mg/dL Final  . Calcium 11/04/2014 9.3  8.9 - 10.3 mg/dL Final  . GFR calc non Af Amer 11/04/2014 >60  >60  mL/min Final  . GFR calc Af Amer 11/04/2014 >60  >60 mL/min Final   Comment: (NOTE) The eGFR has been calculated using the CKD EPI equation. This calculation has not been validated in all clinical situations. eGFR's persistently <60 mL/min signify possible Chronic Kidney Disease.   . Anion gap 11/04/2014 9  5 - 15 Final  . WBC 11/04/2014 4.5  4.0 - 10.5 K/uL Final  . RBC 11/04/2014 4.49  3.87 - 5.11 MIL/uL  Final  . Hemoglobin 11/04/2014 10.9* 12.0 - 15.0 g/dL Final  . HCT 11/04/2014 34.3* 36.0 - 46.0 % Final  . MCV 11/04/2014 76.4* 78.0 - 100.0 fL Final  . MCH 11/04/2014 24.3* 26.0 - 34.0 pg Final  . MCHC 11/04/2014 31.8  30.0 - 36.0 g/dL Final  . RDW 11/04/2014 14.9  11.5 - 15.5 % Final  . Platelets 11/04/2014 141* 150 - 400 K/uL Final  . MRSA by PCR 11/04/2014 NEGATIVE  NEGATIVE Final   Comment:        The GeneXpert MRSA Assay (FDA approved for NASAL specimens only), is one component of a comprehensive MRSA colonization surveillance program. It is not intended to diagnose MRSA infection nor to guide or monitor treatment for MRSA infections.   . Prothrombin Time 11/04/2014 15.3* 11.6 - 15.2 seconds Final  . INR 11/04/2014 1.19  0.00 - 1.49 Final  . aPTT 11/04/2014 33  24 - 37 seconds Final  . Specimen Description 11/04/2014 ABSCESS   Final  . Special Requests 11/04/2014 LEFT PSOAS   Final  . Gram Stain 11/04/2014    Final                   Value:FEW WBC PRESENT, PREDOMINANTLY PMN NO SQUAMOUS EPITHELIAL CELLS SEEN NO ORGANISMS SEEN Performed at [facility]   . Culture 11/04/2014    Final                   Value:NO GROWTH 3 DAYS Performed at [facility]   . Report Status 11/04/2014 11/08/2014 FINAL   Final  . Opiates 11/05/2014 POSITIVE* NONE DETECTED Final  . Cocaine 11/05/2014 NONE DETECTED  NONE DETECTED Final  . Benzodiazepines 11/05/2014 POSITIVE* NONE DETECTED Final  . Amphetamines 11/05/2014 NONE DETECTED  NONE DETECTED Final  . Tetrahydrocannabinol 11/05/2014 NONE DETECTED  NONE DETECTED Final  . Barbiturates 11/05/2014 NONE DETECTED  NONE DETECTED Final   Comment:        DRUG SCREEN FOR MEDICAL PURPOSES ONLY.  IF CONFIRMATION IS NEEDED FOR ANY PURPOSE, NOTIFY LAB WITHIN 5 DAYS.        LOWEST DETECTABLE LIMITS FOR URINE DRUG SCREEN Drug Class       Cutoff (ng/mL) Amphetamine      1000 Barbiturate      200 Benzodiazepine   629 Tricyclics        528 Opiates          300 Cocaine          300 THC              50   . Vancomycin Tr 11/05/2014 18  10.0 - 20.0 ug/mL Final  . Vancomycin Tr 11/07/2014 19  10.0 - 20.0 ug/mL Final  . Hepatitis B Surface Ag 11/08/2014 Negative  Negative Final   Comment: (NOTE) Performed At: [facility] [place], [place] [id number] Lindon Romp MD UV:[id number]   . Hepatitis B-Post 11/08/2014 <3.1* Immunity>9.9 mIU/mL Final   Comment: (NOTE)  Status  of Immunity                     Anti-HBs Level  ------------------                     -------------- Inconsistent with Immunity                   0.0 - 9.9 Consistent with Immunity                          >9.9 Performed At: [facility] [place], [place] [id number] Lindon Romp MD UD:[id number]   . HCV RNA Qnt(log copy/mL) 11/08/2014 UNABLE TO CALCULATE   Corrected   Comment: (NOTE) Unable to calculate result since non-numeric result obtained for component test.   . HepC Qn 11/08/2014 HCV Not Detected   Final  . Test Information 11/08/2014 Comment   Final   Comment: (NOTE) The quantitative range of this assay is 15 IU/mL to 100 million IU/mL. Performed At: [facility] [place], [place] [id number] Lindon Romp MD JO:[id number]   . Hcv Genotype 11/08/2014 RTNI   Final   Not indicated  . HIV Screen 4th Generation wRfx 11/08/2014 Non Reactive  Non Reactive Final   Comment: (NOTE) Performed At: [facility] [place], [place] [id number] Lindon Romp MD OQ:[id number]   . MRSA by PCR 11/07/2014 NEGATIVE  NEGATIVE Final   Comment:        The GeneXpert MRSA Assay (FDA approved for NASAL specimens only), is one component of a comprehensive MRSA colonization surveillance program. It is not intended to diagnose MRSA infection nor to guide or monitor treatment for MRSA infections.   Admission on 10/06/2014, Discharged on 10/12/2014    Component Date Value Ref Range Status  . Sodium 10/06/2014 136  135 - 145 mmol/L Final  . Potassium 10/06/2014 2.8* 3.5 - 5.1 mmol/L Final  . Chloride 10/06/2014 101  101 - 111 mmol/L Final  . CO2 10/06/2014 25  22 - 32 mmol/L Final  . Glucose, Bld 10/06/2014 109* 65 - 99 mg/dL Final  . BUN 10/06/2014 9  6 - 20 mg/dL Final  . Creatinine, Ser 10/06/2014 0.52  0.44 - 1.00 mg/dL Final  . Calcium 10/06/2014 8.7* 8.9 - 10.3 mg/dL Final  . GFR calc non Af Amer 10/06/2014 >60  >60 mL/min Final  . GFR calc Af Amer 10/06/2014 >60  >60 mL/min Final   Comment: (NOTE) The eGFR has been calculated using the CKD EPI equation. This calculation has not been validated in all clinical situations. eGFR's persistently <60 mL/min signify possible Chronic Kidney Disease.   . Anion gap 10/06/2014 10  5 - 15 Final  . WBC 10/06/2014 11.4* 4.0 - 10.5 K/uL Final  . RBC 10/06/2014 4.54  3.87 - 5.11 MIL/uL Final  . Hemoglobin 10/06/2014 11.2* 12.0 - 15.0 g/dL Final  . HCT 10/06/2014 34.2* 36.0 - 46.0 % Final  . MCV 10/06/2014 75.3* 78.0 - 100.0 fL Final  . MCH 10/06/2014 24.7* 26.0 - 34.0 pg Final  . MCHC 10/06/2014 32.7  30.0 - 36.0 g/dL Final  . RDW 10/06/2014 15.3  11.5 - 15.5 % Final  . Platelets 10/06/2014 178  150 - 400 K/uL Final  . Neutrophils Relative % 10/06/2014 82   Final  . Lymphocytes Relative 10/06/2014 12   Final  . Monocytes Relative 10/06/2014 6  Final  . Eosinophils Relative 10/06/2014 0   Final  . Basophils Relative 10/06/2014 0   Final  . Neutro Abs 10/06/2014 9.3* 1.7 - 7.7 K/uL Final  . Lymphs Abs 10/06/2014 1.4  0.7 - 4.0 K/uL Final  . Monocytes Absolute 10/06/2014 0.7  0.1 - 1.0 K/uL Final  . Eosinophils Absolute 10/06/2014 0.0  0.0 - 0.7 K/uL Final  . Basophils Absolute 10/06/2014 0.0  0.0 - 0.1 K/uL Final  . Specimen Description 10/06/2014 URINE, CLEAN CATCH   Final  . Special Requests 10/06/2014 NONE   Final  . Culture 10/06/2014    Final                   Value:>=100,000  COLONIES/mL ESCHERICHIA COLI Performed at [facility]   . Report Status 10/06/2014 10/09/2014 FINAL   Final  . Organism ID, Bacteria 10/06/2014 ESCHERICHIA COLI   Final  . Color, Urine 10/06/2014 YELLOW  YELLOW Final  . APPearance 10/06/2014 CLOUDY* CLEAR Final  . Specific Gravity, Urine 10/06/2014 1.015  1.005 - 1.030 Final  . pH 10/06/2014 6.0  5.0 - 8.0 Final  . Glucose, UA 10/06/2014 NEGATIVE  NEGATIVE mg/dL Final  . Hgb urine dipstick 10/06/2014 MODERATE* NEGATIVE Final  . Bilirubin Urine 10/06/2014 NEGATIVE  NEGATIVE Final  . Ketones, ur 10/06/2014 NEGATIVE  NEGATIVE mg/dL Final  . Protein, ur 10/06/2014 NEGATIVE  NEGATIVE mg/dL Final  . Urobilinogen, UA 10/06/2014 1.0  0.0 - 1.0 mg/dL Final  . Nitrite 10/06/2014 NEGATIVE  NEGATIVE Final  . Leukocytes, UA 10/06/2014 SMALL* NEGATIVE Final  . Squamous Epithelial / LPF 10/06/2014 RARE  RARE Final  . WBC, UA 10/06/2014 7-10  <3 WBC/hpf Final  . RBC / HPF 10/06/2014 0-2  <3 RBC/hpf Final  . Bacteria, UA 10/06/2014 MANY* RARE Final  . Specimen Description 10/07/2014 WOUND   Final  . Special Requests 10/07/2014 LUMBAR    Final  . Gram Stain 10/07/2014    Final                   Value:FEW WBC PRESENT, PREDOMINANTLY PMN NO SQUAMOUS EPITHELIAL CELLS SEEN FEW GRAM POSITIVE COCCI IN PAIRS Performed at [facility]   . Culture 10/07/2014    Final                   Value:NO ANAEROBES ISOLATED Performed at [facility]   . Report Status 10/07/2014 10/12/2014 FINAL   Final  . Specimen Description 10/07/2014 WOUND   Final  . Special Requests 10/07/2014 LUMBAR   Final  . Gram Stain 10/07/2014    Final                   Value:RARE WBC PRESENT, PREDOMINANTLY PMN NO ORGANISMS SEEN   . Report Status 10/07/2014 10/07/2014 FINAL   Final  . Specimen Description 10/07/2014 BLOOD LEFT ANTECUBITAL   Final  . Special Requests 10/07/2014 BOTTLES DRAWN AEROBIC AND ANAEROBIC 5CC   Final  . Culture 10/07/2014 NO GROWTH 5 DAYS    Final  . Report Status 10/07/2014 10/12/2014 FINAL   Final  . Specimen Description 10/07/2014 WOUND   Final  . Special Requests 10/07/2014 LUMBAR   Final  . Gram Stain 10/07/2014    Final                   Value:FEW WBC PRESENT, PREDOMINANTLY PMN NO SQUAMOUS EPITHELIAL CELLS SEEN MODERATE GRAM POSITIVE COCCI IN PAIRS Performed at [facility]   .  Culture 10/07/2014    Final                   Value:MODERATE VIRIDANS STREPTOCOCCUS Performed at [facility]   . Report Status 10/07/2014 10/11/2014 FINAL   Final  . MRSA by PCR 10/07/2014 POSITIVE* NEGATIVE Final   Comment:        The GeneXpert MRSA Assay (FDA approved for NASAL specimens only), is one component of a comprehensive MRSA colonization surveillance program. It is not intended to diagnose MRSA infection nor to guide or monitor treatment for MRSA infections. RESULT CALLED TO, READ BACK BY AND VERIFIED WITH: SUMMERELL,P RN @ [phone] 10/07/14 LEONARD,A   . Sed Rate 10/07/2014 87* 0 - 22 mm/hr Final    Mr Lumbar Spine W Wo Contrast  11/04/2014  CLINICAL DATA:  Initial evaluation for severe left leg pain, increasing with movement. Constant numbness in left leg. History of back surgery. EXAM: MRI LUMBAR SPINE WITHOUT AND WITH CONTRAST TECHNIQUE: Multiplanar and multiecho pulse sequences of the lumbar spine were obtained without and with intravenous contrast. CONTRAST:  54m MULTIHANCE GADOBENATE DIMEGLUMINE 529 MG/ML IV SOLN COMPARISON:  Prior MRI from 10/07/2014. FINDINGS: Study is mildly degraded by motion artifact. Patient is status post posterior instrumentation at L2 through S1. Alignment is stable with grade 1 anterolisthesis of L5 on S1. Enhancement about the interbody graft at L5-S1 again seen. Vertebral body heights are maintained. Postoperative changes from interval performance of decompressive laminectomy at the L1 and L2 levels. Postoperative collection at the laminectomy site measures 2.9 x 2.4 x 1.9 cm. No  significant enhancement about this collection, favoring postoperative seroma. There is superficial extension along the incisional site with small amount of fluid along the incisional wound (series 6, image 4). There is persistent abnormal T1 hypo intense, T2/STIR hyperintense signal intensity within the L1 and L2 vertebral bodies with postcontrast enhancement, concerning for persistent osteomyelitis discitis. There is edema with enhancement within the adjacent psoas musculature, suggesting associated myositis. Irregular T2 hyperintense rim enhancing collections now seen at the left anterior margin of the superior aspect of L2, measuring 2.6 x 1.6 cm, concerning for paraspinous abscess (series 6, image 14). Additional more confluent T2 hyperintense collection more inferiorly within the left psoas muscle of the level of L4 measures 2.3 x 1.0 cm (series 6, image 28), also suspicious for abscess. There is a 4.4 x 1.7 x 2.3 cm collection within the ventral epidural space centered at the level of L1-2, but spanning from L1 through the superior aspect of L3. Associated irregular enhancement on post-contrast sequences. Findings concerning for recurrent epidural abscess. Abnormal enhancement throughout the epidural space seen at the L1-2 level, which main in large part be related to prior surgery. There is fairly severe canal stenosis at the level of L1-2 with the thecal sac measuring 10 mm in AP diameter at its most narrow point. Additional multilevel degenerative changes are grossly similar. Marked distension of the bladder noted. IMPRESSION: 1. 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 Resultant severe canal stenosis at the level of L1-2 despite interval performance of L1/L2 decompressive laminectomy. 2. Myositis in the bilateral psoas musculature with paraspinous abscesses at the left anterior aspect of L2 and  inferiorly at L4-5. 3. Status post L2 through S1 PLIF with interval performance of additional L1 and L2 decompressive laminectomy. Postoperative collection at the L1-2 laminectomy site as above. 4. Grade 1  anterolisthesis of L5 on S1, stable. Critical Value/emergent results were called by telephone at the time of interpretation on 11/04/2014 at 2:01 am to [place] , who verbally acknowledged these results. Electronically Signed   By: Jeannine Boga M.D.   On: 11/04/2014 02:02   Ct Aspiration  11/04/2014  CLINICAL DATA:  Discitis at L1-2 EXAM: CT-GUIDED  LEFT PSOAS ABSCESS ASPIRATION MEDICATIONS AND MEDICAL HISTORY: Versed 3 mg, Fentanyl 100 mcg. Additional Medications: NONE. ANESTHESIA/SEDATION: Moderate sedation time: 8 minutes PROCEDURE: The procedure, risks, benefits, and alternatives were explained to the patient. Questions regarding the procedure were encouraged and answered. The patient understands and consents to the procedure. The BACK was prepped with Betadine in a sterile fashion, and a sterile drape was applied covering the operative field. A sterile gown and sterile gloves were used for the procedure. Under CT guidance, an 18 gauge needle was advanced into the abscess anterior and to the left of the L2 vertebral body within the psoas muscle. 3 cc serosanguineous fluid was aspirated and sent for culture. Final imaging was performed. Patient tolerated the procedure well without complication. Vital sign monitoring by nursing staff during the procedure will continue as patient is in the special procedures unit for post procedure observation. FINDINGS: The images document guide needle placement within the left psoas abscess at L2. Post biopsy images demonstrate no hemorrhage. COMPLICATIONS: None IMPRESSION: Successful CT-guided aspiration of a left psoas abscess at L2. Electronically Signed   By: Marybelle Killings M.D.   On: 11/04/2014 16:53     Assessment/Plan   ICD-9-CM ICD-10-CM   1. Spinal  abscess (HCC) 730.08 M46.20   2. Discitis of lumbar region 722.93 M46.46   3. Spinal epidural abscess 324.1 G06.1   4. Chronic pain 338.29 G89.29   5. Depression 311 F32.9   6. IVDU (intravenous drug user) 305.90 F19.90   7. History of hepatitis C V12.09 Z86.19   8. Radicular pain of left lower extremity 724.4 M54.10     Cont current meds as ordered. To complete all abx 11/24th  PT/Ot/ST as ordered  PICC line care  F/u with specialists as scheduled  Check weekly CBC w diff, CMP, sed rate and CRP. Vanco to be dosed by pharmacy. Fax results to ID  GOAL: short term rehab and d/c home when medically appropriate. Communicated with pt and nursing.  Will follow  Ona Roehrs S. Perlie Gold  [facility] [address] [place], [place] [address] [phone] Cell (Monday-Friday 8 AM - 5 PM) [phone] After 5 PM and follow prompts

## 2014-11-13 ENCOUNTER — Encounter: Payer: Self-pay | Admitting: Internal Medicine

## 2014-11-22 ENCOUNTER — Emergency Department (HOSPITAL_COMMUNITY): Payer: Medicare Other

## 2014-11-22 ENCOUNTER — Emergency Department (HOSPITAL_COMMUNITY)
Admission: EM | Admit: 2014-11-22 | Discharge: 2014-11-23 | Disposition: A | Discharge status: Home / Self Care | Payer: Medicare Other | Attending: Emergency Medicine | Admitting: Emergency Medicine

## 2014-11-22 ENCOUNTER — Encounter (HOSPITAL_COMMUNITY): Payer: Self-pay | Admitting: Emergency Medicine

## 2014-11-22 DIAGNOSIS — G8929 Other chronic pain: Secondary | ICD-10-CM | POA: Diagnosis not present

## 2014-11-22 DIAGNOSIS — Z79899 Other long term (current) drug therapy: Secondary | ICD-10-CM | POA: Diagnosis not present

## 2014-11-22 DIAGNOSIS — M4646 Discitis, unspecified, lumbar region: Secondary | ICD-10-CM

## 2014-11-22 DIAGNOSIS — M6281 Muscle weakness (generalized): Secondary | ICD-10-CM | POA: Insufficient documentation

## 2014-11-22 DIAGNOSIS — Z791 Long term (current) use of non-steroidal anti-inflammatories (NSAID): Secondary | ICD-10-CM | POA: Diagnosis not present

## 2014-11-22 DIAGNOSIS — Z87891 Personal history of nicotine dependence: Secondary | ICD-10-CM | POA: Diagnosis not present

## 2014-11-22 DIAGNOSIS — F329 Major depressive disorder, single episode, unspecified: Secondary | ICD-10-CM | POA: Insufficient documentation

## 2014-11-22 DIAGNOSIS — K219 Gastro-esophageal reflux disease without esophagitis: Secondary | ICD-10-CM | POA: Insufficient documentation

## 2014-11-22 DIAGNOSIS — R29898 Other symptoms and signs involving the musculoskeletal system: Secondary | ICD-10-CM

## 2014-11-22 DIAGNOSIS — M545 Low back pain: Secondary | ICD-10-CM

## 2014-11-22 DIAGNOSIS — M5410 Radiculopathy, site unspecified: Secondary | ICD-10-CM | POA: Diagnosis not present

## 2014-11-22 DIAGNOSIS — Z7951 Long term (current) use of inhaled steroids: Secondary | ICD-10-CM | POA: Diagnosis not present

## 2014-11-22 DIAGNOSIS — M4620 Osteomyelitis of vertebra, site unspecified: Secondary | ICD-10-CM

## 2014-11-22 LAB — CBC WITH DIFFERENTIAL/PLATELET
BASOS ABS: 0 10*3/uL (ref 0.0–0.1)
Basophils Relative: 0 %
Eosinophils Absolute: 0.2 10*3/uL (ref 0.0–0.7)
Eosinophils Relative: 5 %
HCT: 31.1 % — ABNORMAL LOW (ref 36.0–46.0)
HEMOGLOBIN: 10.3 g/dL — AB (ref 12.0–15.0)
LYMPHS ABS: 0.7 10*3/uL (ref 0.7–4.0)
Lymphocytes Relative: 14 %
MCH: 24.6 pg — ABNORMAL LOW (ref 26.0–34.0)
MCHC: 33.1 g/dL (ref 30.0–36.0)
MCV: 74.4 fL — ABNORMAL LOW (ref 78.0–100.0)
MONO ABS: 0.3 10*3/uL (ref 0.1–1.0)
Monocytes Relative: 6 %
NEUTROS PCT: 75 %
Neutro Abs: 3.7 10*3/uL (ref 1.7–7.7)
PLATELETS: 126 10*3/uL — AB (ref 150–400)
RBC: 4.18 MIL/uL (ref 3.87–5.11)
RDW: 14.7 % (ref 11.5–15.5)
WBC: 4.9 10*3/uL (ref 4.0–10.5)

## 2014-11-22 LAB — COMPREHENSIVE METABOLIC PANEL
ALT: 10 U/L — AB (ref 14–54)
AST: 13 U/L — AB (ref 15–41)
Albumin: 3.2 g/dL — ABNORMAL LOW (ref 3.5–5.0)
Alkaline Phosphatase: 129 U/L — ABNORMAL HIGH (ref 38–126)
Anion gap: 11 (ref 5–15)
BILIRUBIN TOTAL: 0.4 mg/dL (ref 0.3–1.2)
BUN: 5 mg/dL — AB (ref 6–20)
CO2: 26 mmol/L (ref 22–32)
CREATININE: 0.59 mg/dL (ref 0.44–1.00)
Calcium: 9.3 mg/dL (ref 8.9–10.3)
Chloride: 98 mmol/L — ABNORMAL LOW (ref 101–111)
Glucose, Bld: 110 mg/dL — ABNORMAL HIGH (ref 65–99)
POTASSIUM: 3.3 mmol/L — AB (ref 3.5–5.1)
Sodium: 135 mmol/L (ref 135–145)
TOTAL PROTEIN: 6.7 g/dL (ref 6.5–8.1)

## 2014-11-22 LAB — I-STAT CHEM 8, ED
BUN: 5 mg/dL — ABNORMAL LOW (ref 6–20)
Calcium, Ion: 1.17 mmol/L (ref 1.12–1.23)
Chloride: 100 mmol/L — ABNORMAL LOW (ref 101–111)
Creatinine, Ser: 0.6 mg/dL (ref 0.44–1.00)
Glucose, Bld: 111 mg/dL — ABNORMAL HIGH (ref 65–99)
HEMATOCRIT: 34 % — AB (ref 36.0–46.0)
HEMOGLOBIN: 11.6 g/dL — AB (ref 12.0–15.0)
POTASSIUM: 3.3 mmol/L — AB (ref 3.5–5.1)
SODIUM: 139 mmol/L (ref 135–145)
TCO2: 24 mmol/L (ref 0–100)

## 2014-11-22 LAB — SEDIMENTATION RATE: SED RATE: 78 mm/h — AB (ref 0–22)

## 2014-11-22 LAB — C-REACTIVE PROTEIN: CRP: 5.2 mg/dL — AB (ref <1.0)

## 2014-11-22 LAB — I-STAT CG4 LACTIC ACID, ED: LACTIC ACID, VENOUS: 0.58 mmol/L (ref 0.5–2.0)

## 2014-11-22 MED ORDER — ONDANSETRON HCL 4 MG/2ML IJ SOLN: 4.0000 mg | Freq: Once | INTRAMUSCULAR | Status: DC

## 2014-11-22 MED ORDER — HYDROMORPHONE HCL 1 MG/ML IJ SOLN
1.0000 mg | Freq: Once | INTRAMUSCULAR | Status: AC
  Administered 2014-11-22: 1 mg via INTRAVENOUS
  Filled 2014-11-22: qty 1

## 2014-11-22 MED ORDER — SODIUM CHLORIDE 0.9 % IV BOLUS (SEPSIS)
1000.0000 mL | Freq: Once | INTRAVENOUS | Status: AC
  Administered 2014-11-22: 1000 mL via INTRAVENOUS

## 2014-11-22 NOTE — ED Notes (Signed)
MD at bedside. 

## 2014-11-22 NOTE — ED Provider Notes (Signed)
CSN: [id number]     Arrival date & time 11/22/14  2028 History   First MD Initiated Contact with Patient 11/22/14 2052     Chief Complaint  Patient presents with  . Back Pain     (Consider location/radiation/quality/duration/timing/severity/associated sxs/prior Treatment) Patient is a 54 y.o. female presenting with back pain. The history is provided by the patient and medical records.  Back Pain Location:  Lumbar spine Quality:  Shooting and stiffness Radiates to:  L posterior upper leg Pain severity:  Severe Pain is:  Worse during the day Onset quality:  Gradual Timing:  Intermittent Progression:  Unchanged Chronicity:  Recurrent Context: physical stress, recent illness and recent injury   Relieved by:  Nothing Worsened by:  Touching Ineffective treatments:  Lying down, muscle relaxants and NSAIDs Associated symptoms: no abdominal pain, no chest pain, no dysuria, no fever, no headaches and no weakness   Risk factors: obesity and recent surgery     Past Medical History  Diagnosis Date  . Depression   . Chronic back pain   . GERD (gastroesophageal reflux disease)   . Chronic pain    Past Surgical History  Procedure Laterality Date  . Cervical fusion    . Lumbar laminectomy/decompression microdiscectomy  10/07/2014    Procedure: LUMBAR LAMINECTOMY  Lumbar one - two;  Surgeon: Tressie Stalker, MD;  Location: [facility];  Service: Neurosurgery;;   Family History  Problem Relation Age of Onset  . Drug abuse Other    Social History  Substance Use Topics  . Smoking status: Former Smoker -- 0.30 packs/day    Types: Cigarettes  . Smokeless tobacco: None  . Alcohol Use: No   OB History    No data available     Review of Systems  Constitutional: Negative for fever.  HENT: Negative for facial swelling.   Respiratory: Negative for shortness of breath.   Cardiovascular: Negative for chest pain.  Gastrointestinal: Negative for abdominal pain.  Genitourinary: Negative  for dysuria and hematuria.  Musculoskeletal: Positive for back pain.  Skin: Negative for rash.  Neurological: Negative for weakness and headaches.  Psychiatric/Behavioral: Negative for confusion.      Allergies  Review of patient's allergies indicates no known allergies.  Home Medications   Prior to Admission medications   Medication Sig Start Date End Date Taking? Authorizing Provider  buPROPion (WELLBUTRIN) 100 MG tablet Take 100 mg by mouth 2 (two) times daily.   Yes Historical Provider, MD  cefTRIAXone 2 g in dextrose 5 % 50 mL Inject 2 g into the vein daily. Stop date 12/15/14 11/08/14  Yes Shanker Levora Dredge, MD  clotrimazole (LOTRIMIN) 1 % cream Apply 1 application topically 2 (two) times daily.   Yes Historical Provider, MD  cyclobenzaprine (FLEXERIL) 10 MG tablet Take 10 mg by mouth 3 (three) times daily as needed for muscle spasms.   Yes Historical Provider, MD  diclofenac sodium (VOLTAREN) 1 % GEL Apply 2 g topically 4 (four) times daily. 11/08/14  Yes Shanker Levora Dredge, MD  gabapentin (NEURONTIN) 300 MG capsule Take 2 capsules (600 mg total) by mouth 3 (three) times daily. 11/08/14  Yes Shanker Levora Dredge, MD  HYDROmorphone (DILAUDID) 2 MG tablet Take 1 tablet (2 mg total) by mouth every 4 (four) hours as needed for severe pain. 11/08/14  Yes Shanker Levora Dredge, MD  methadone (DOLOPHINE) 10 MG tablet Take 1-2 tablets (10-20 mg total) by mouth 3 (three) times daily. 20 mg at 8am, 10 pm at 2pm and  20 mg at 8pm Patient taking differently: Take 10-20 mg by mouth 3 (three) times daily. 20 mg at 8am, 10 mg at 2pm and 20 mg at 8pm 11/08/14  Yes Shanker Levora DredgeM Ghimire, MD  omeprazole (PRILOSEC) 40 MG capsule Take 40 mg by mouth daily.   Yes Historical Provider, MD  polyethylene glycol (MIRALAX) packet Take 17 g by mouth 2 (two) times daily. 11/08/14  Yes Shanker Levora DredgeM Ghimire, MD  promethazine (PHENERGAN) 25 MG tablet Take 25 mg by mouth every 6 (six) hours as needed for nausea or vomiting.   Yes  Historical Provider, MD  rifampin (RIFADIN) 300 MG capsule Take 1 capsule (300 mg total) by mouth 2 (two) times daily. Stop date 12/15/14 11/08/14  Yes Shanker Levora DredgeM Ghimire, MD  senna (SENOKOT) 8.6 MG TABS tablet Take 2 tablets (17.2 mg total) by mouth at bedtime. 11/08/14  Yes Shanker Levora DredgeM Ghimire, MD  sertraline (ZOLOFT) 100 MG tablet Take 100 mg by mouth daily.   Yes Historical Provider, MD  Vancomycin (VANCOCIN) 750 MG/150ML SOLN Inject 150 mLs (750 mg total) into the vein every 8 (eight) hours. Stop date 12/15/14 11/08/14  Yes Shanker Levora DredgeM Ghimire, MD  Amino Acids-Protein Hydrolys (FEEDING SUPPLEMENT, PRO-STAT SUGAR FREE 64,) LIQD Take 30 mLs by mouth daily.    Historical Provider, MD  Multiple Vitamins-Minerals (MULTIVITAMIN ADULT PO) Take 1 tablet by mouth daily.    Historical Provider, MD   BP 166/96 mmHg  Pulse 77  Temp(Src) 98.1 F (36.7 C) (Oral)  Resp 20  SpO2 96% Physical Exam  Constitutional: She is oriented to person, place, and time. She appears well-developed and well-nourished. No distress.  HENT:  Head: Normocephalic and atraumatic.  Right Ear: External ear normal.  Left Ear: External ear normal.  Nose: Nose normal.  Mouth/Throat: Oropharynx is clear and moist. No oropharyngeal exudate.  Eyes: Conjunctivae and EOM are normal. Pupils are equal, round, and reactive to light. Right eye exhibits no discharge. Left eye exhibits no discharge. No scleral icterus.  Neck: Normal range of motion. Neck supple. No JVD present. No tracheal deviation present. No thyromegaly present.  Cardiovascular: Normal rate, regular rhythm and intact distal pulses.   Pulmonary/Chest: Effort normal and breath sounds normal. No stridor. No respiratory distress. She has no wheezes. She has no rales. She exhibits no tenderness.  Abdominal: Soft. She exhibits no distension.  Musculoskeletal: Normal range of motion. She exhibits no edema or tenderness.  Lymphadenopathy:    She has no cervical adenopathy.   Neurological: She is alert and oriented to person, place, and time. She displays no atrophy, no tremor and normal reflexes. No cranial nerve deficit or sensory deficit. She exhibits normal muscle tone. She displays no seizure activity. Coordination and gait normal. GCS eye subscore is 4. GCS verbal subscore is 5. GCS motor subscore is 6.  Skin: Skin is warm and dry. No rash noted. She is not diaphoretic. No erythema. No pallor.  Psychiatric: Her behavior is normal. Judgment and thought content normal. Her mood appears anxious.  Nursing note and vitals reviewed.   ED Course  Procedures (including critical care time) Labs Review Labs Reviewed  COMPREHENSIVE METABOLIC PANEL - Abnormal; Notable for the following:    Potassium 3.3 (*)    Chloride 98 (*)    Glucose, Bld 110 (*)    BUN 5 (*)    Albumin 3.2 (*)    AST 13 (*)    ALT 10 (*)    Alkaline Phosphatase 129 (*)  All other components within normal limits  CBC WITH DIFFERENTIAL/PLATELET - Abnormal; Notable for the following:    Hemoglobin 10.3 (*)    HCT 31.1 (*)    MCV 74.4 (*)    MCH 24.6 (*)    Platelets 126 (*)    All other components within normal limits  SEDIMENTATION RATE - Abnormal; Notable for the following:    Sed Rate 78 (*)    All other components within normal limits  C-REACTIVE PROTEIN - Abnormal; Notable for the following:    CRP 5.2 (*)    All other components within normal limits  I-STAT CHEM 8, ED - Abnormal; Notable for the following:    Potassium 3.3 (*)    Chloride 100 (*)    BUN 5 (*)    Glucose, Bld 111 (*)    Hemoglobin 11.6 (*)    HCT 34.0 (*)    All other components within normal limits  I-STAT CG4 LACTIC ACID, ED    Imaging Review Mr Lumbar Spine W Wo Contrast  11/23/2014  CLINICAL DATA:  Low back pain radiating to at LEFT leg and hip for several weeks, worsening today. On IV antibiotics for L1-2 osteomyelitis and epidural abscess. EXAM: MRI LUMBAR SPINE WITHOUT AND WITH CONTRAST TECHNIQUE:  Multiplanar and multiecho pulse sequences of the lumbar spine were obtained without and with intravenous contrast. CONTRAST:  20mL MULTIHANCE GADOBENATE DIMEGLUMINE 529 MG/ML IV SOLN COMPARISON:  MRI lumbar spine November 04, 2014 FINDINGS: Moderately motion degraded examination. L1-2 intradiscal fluid collection, and endplate edema with abnormal enhancement again noted, slight increased destructive changes 8 L1 inferior endplate compared to prior study. However, the ventral epidural fluid collection at L1-2 has decreased in size, 9 x 25 mm, previously 17 x 44 mm. Interspinous fluid collection at L1-2 is slightly smaller and, the paraspinal fluid collection within the subcutaneous fat is smaller. Similar enhancement of the L5-S1 inter discal material, and mild bright STIR acute discogenic endplate changes. Grade 1 L1-2 anterolisthesis, status post L2 through sacral posterior instrumentation, L1 to through L5-S1 laminectomies. Which results in extensive susceptibility artifact limiting evaluation, further exacerbated by patient motion. Conus medullaris terminates at L1. 20 x 12 mm LEFT prevertebral abscess was 26 x 16 mm. 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 Paraspinal muscle atrophy. Level by level evaluation: T12-L1: Moderate broad-based disc bulge, facet arthropathy in epidural fat result in moderate canal stenosis at T12-L1, unchanged. No significant neural foraminal narrowing. L1-2: AP dimension of the spinal canal at L1 is 11 mm, previously 9 mm, due to the ventral epidural fluid collection and facet arthropathy. Neural foramen cannot be assessed. L2-3, L3-4, L4-5, L5-S1: Status post PLIF. Widely patent spinal canal. Neural foramen cannot be assessed. IMPRESSION: Mild further destructive bony changes L1-2 compatible with history of osteomyelitis though, there is interval decrease in size of multi focal fluid collections, including ventral epidural fluid collection consistent with response to  treatment. Partially re-expanded spinal canal at L1. Similar enhancement about the L5-S1 interbody fusion material which can be seen with instability or, infection. Grade 1 L5-S1 anterolisthesis. Recommend close attention on follow-up. Electronically Signed   By: Awilda Metro M.D.   On: 11/23/2014 00:56   I have personally reviewed and evaluated these images and lab results as part of my medical decision-making.   EKG Interpretation None      MDM   Final diagnoses:  Left leg weakness  Lumbar discitis  Spinal abscess (HCC)  Radicular pain of left lower extremity  Low back pain, unspecified back pain laterality, with sciatica presence unspecified    Patient has history of ostomy mellitus of the spine. She is currently on IV antibiotics via PICC line at a SNF. Patient has a history of IVDA. Endorsing worsening left leg pain and movement limited by pain, not specifically weakness. Patient does not tolerate palpation to the lumbar spine, thus limiting the exam. Patient underwent additional MRI imaging due to recent fluid collect collection seen postoperatively. CT shows interval improvement in fluid collection, with ongoing osteomyelitis in adjacent tissues. Patient was discussed with neurosurgery and no acute interventions are indicated per their evaluation. Patient was advised to seek additional pain management resources available at her SNF, otherwise no indication for additional workup appears necessary at this time. Remainder of workup not suggestive of an additional acute infectious, mechanical, or inflammatory process to account for patient's symptoms. Discussed the findings with the patient at length. She has follow-up with infectious disease tomorrow. I advised patient that other than acute treatment of pain no additional narcotics would be prescribed through the emergency department at this time.  She is on methadone therapy currently and I advised her to discuss her ongoing pain  management needs with her current treating physicians.  Patient was given return precautions for back pain.  Pt advised on use of medications as applicable.  Advised to return for actely worsening symptoms, inability to take medications, or other acute concerns.  Advised to follow up with ID in 1 day.  Patient was in agreement with and expressed understanding of follow plan, plan of care, and return precautions.  All questions answered prior to discharge.  Patient was discharged in stable condition by EMS.  Patient care was discussed with my attending, Dr. Verdie Mosher.   Gavin Pound, MD 11/24/14 1610  Lavera Guise, MD 11/25/14 1125

## 2014-11-22 NOTE — ED Notes (Signed)
Pt. Arrived with EMS from [facility]  reports worsening low back pain radiating to left leg and left hip  for several weeks worse today , denies injury or fall , currently receiving IV antibiotic at nursing home for her epidural abscess .

## 2014-11-22 NOTE — ED Notes (Signed)
Patient transported to MRI 

## 2014-11-23 ENCOUNTER — Telehealth: Payer: Self-pay | Admitting: *Deleted

## 2014-11-23 ENCOUNTER — Encounter: Payer: Self-pay | Admitting: Infectious Diseases

## 2014-11-23 ENCOUNTER — Ambulatory Visit (INDEPENDENT_AMBULATORY_CARE_PROVIDER_SITE_OTHER): Payer: Medicare Other | Admitting: Infectious Diseases

## 2014-11-23 VITALS — BP 140/81 | HR 76 | Temp 98.0°F | Ht 66.0 in | Wt 236.0 lb

## 2014-11-23 DIAGNOSIS — M4620 Osteomyelitis of vertebra, site unspecified: Secondary | ICD-10-CM

## 2014-11-23 DIAGNOSIS — M545 Low back pain: Secondary | ICD-10-CM | POA: Diagnosis not present

## 2014-11-23 DIAGNOSIS — Z8619 Personal history of other infectious and parasitic diseases: Secondary | ICD-10-CM

## 2014-11-23 MED ORDER — HEPARIN SOD (PORK) LOCK FLUSH 100 UNIT/ML IV SOLN
500.0000 [IU] | Freq: Once | INTRAVENOUS | Status: AC
  Administered 2014-11-23: 500 [IU]
  Filled 2014-11-23: qty 5

## 2014-11-23 MED ORDER — HYDROMORPHONE HCL 1 MG/ML IJ SOLN
1.0000 mg | Freq: Once | INTRAMUSCULAR | Status: AC
  Administered 2014-11-23: 1 mg via INTRAVENOUS
  Filled 2014-11-23: qty 1

## 2014-11-23 MED ORDER — GADOBENATE DIMEGLUMINE 529 MG/ML IV SOLN
20.0000 mL | Freq: Once | INTRAVENOUS | Status: AC | PRN
Start: 2014-11-23 — End: 2014-11-23
  Administered 2014-11-23: 20 mL via INTRAVENOUS

## 2014-11-23 MED ORDER — KETOROLAC TROMETHAMINE 60 MG/2ML IM SOLN
60.0000 mg | Freq: Once | INTRAMUSCULAR | Status: AC
  Administered 2014-11-23: 60 mg via INTRAMUSCULAR
  Filled 2014-11-23: qty 2

## 2014-11-23 NOTE — ED Provider Notes (Signed)
I saw and evaluated the patient, reviewed the resident's note and I agree with the findings and plan.  54 year old female with history of IV drug use complicated by epidural abscess who presents with worsening back pain. She is receiving IV antibiotics through a PICC line at a senior nursing facility. Was recently readmitted to the hospital for persistent abscess that required CT-guided drainage. Returns with worsening low back pain. States baseline weakness in the left lower extremity and numbness and tingling in her bilateral feet. No new neurological complaints, and denies any urinary incontinence/retention or bowel incontinence. No leukocytosis, afebrile, persistently elevated inflammatory markers. MRI performed, showing no recurrent abscess but persistent osteomyelitis. Discussed with Dr. Conchita ParisNundkumar who felt changes were stable. Appropriate for discharge back to nursing facility.  Mackenzie Guiseana Duo Reanna Scoggin, MD 11/23/14 [phone]

## 2014-11-23 NOTE — Telephone Encounter (Signed)
Pt seen in [facility] yesterday, not able to come today for appt.  Pt is wondering if the labs and MRI done at the [facility] ED yesterday will help Dr. Ninetta LightsHatcher make decisions about her care.  The pt stated that her antibiotics were to continue until 12/14/14.  Will have [facility] scheduler call the pt to make new [facility] appt.

## 2014-11-23 NOTE — Assessment & Plan Note (Signed)
Her VL is negative.  I would suggest she is immune.

## 2014-11-23 NOTE — Progress Notes (Signed)
   Subjective:    Patient ID: Mackenzie Charles, female    DOB: [birth year], 54 y.o.   MRN: [id number]  HPI 54 y.o. F with depression, chronic pain and numerous back surgeries ([facility]), last in Jan 2015 for fusion. She was seen in mid September with worsening back pain and found to have lumbar discitis and epidural abscess. She underwent I x D by Dr. Lovell SheehanJenkins. OR cx grew microaerophilic strep on 9/17. She was discharged on 6 wks of ceftriaxone 2gm IV daily plus oral rifampin.  She had worsening back pain radiating down her left leg, thus wen to [facility] for evaluation. A follow-up MRI demonstrated 2 new areas of osteomyelitis discitis and ventral epidural abscess, plus psoas abscess. She was adm on 10-14 and started on vancomycin and piptazo then changed to vanco/ceftriaxone/rifampin. She had fluid aspirate done on 10-14 that was Cx (-).  SNF had concern that she was using picc line reacreationally while she was not at the facility (was leaving the facility).   She had repeat MRI in ED 11-2 that showed: Mild further destructive bony changes L1-2 compatible with history of osteomyelitis though, there is interval decrease in size of multi focal fluid collections, including ventral epidural fluid collection consistent with response to treatment. Partially re-expanded spinal canal at L1. Similar enhancement about the L5-S1 interbody fusion material which can be seen with instability or, infection. Grade 1 L5-S1 anterolisthesis. Recommend close attention on follow-up.   Has had pain radiating down her leg from her back on L.  No fever or chills.   No problems with PIC line.  Has numbness in her feet- "theyr'e asleep". This is unchanged from previous.  Has f/u with Dr Lovell SheehanJenkins on 12-05-14.    Review of Systems  Constitutional: Negative for fever, chills, appetite change and unexpected weight change.  Gastrointestinal: Positive for constipation. Negative for diarrhea.    Genitourinary: Negative for difficulty urinating.       Objective:   Physical Exam  Constitutional: She appears well-developed and well-nourished.  HENT:  Mouth/Throat: No oropharyngeal exudate.  Eyes: EOM are normal. Pupils are equal, round, and reactive to light.  Neck: Neck supple.  Cardiovascular: Normal rate, regular rhythm and normal heart sounds.   Pulmonary/Chest: Effort normal and breath sounds normal.  Abdominal: Soft. Bowel sounds are normal. There is no tenderness. There is no rebound.  Musculoskeletal: She exhibits no edema.       Arms: Lymphadenopathy:    She has no cervical adenopathy.  Neurological:  Grossly nl light touch BLE, nl plantar and dorsiflexion.       Assessment & Plan:

## 2014-11-23 NOTE — Telephone Encounter (Signed)
Per Dr. Johnnye Sima, relayed orders to [facility] to continue IV vancomycin, IV ceftriaxone, oral rifampin through 12/24.  Reiterated that weekly CBC/diff, CMP, ESR, and CRP should be drawn and faxed to [phone] and asked for all previous labs to be faxed to Dr. Algis Downs attention today. Patient needs to follow up in 4 weeks.  The scheduler at [facility] is in the office from 7-3.  RN will call 11/3 to schedule follow up appointment for this patient. Landis Gandy, RN

## 2014-11-23 NOTE — Assessment & Plan Note (Addendum)
Need her labs from her SNF Will extend her anbx by 1 month given most recent MRI Will see her back in 6 weeks.  She has f/u this month with neurosurgery.  Repeat MRI at end of therapy to determine if she needs prolonged PO.

## 2014-11-23 NOTE — Discharge Instructions (Signed)
Follow up with infectious disease on 11/2 as scheduled.

## 2014-11-23 NOTE — ED Notes (Signed)
[facility] contacted to tx patient to [facility]

## 2014-11-25 NOTE — Telephone Encounter (Signed)
Patient is scheduled for 11/30 at 2:30. Confirmed with [facility].

## 2014-12-02 ENCOUNTER — Non-Acute Institutional Stay (SKILLED_NURSING_FACILITY): Payer: Medicare Other | Admitting: Adult Health

## 2014-12-02 DIAGNOSIS — I10 Essential (primary) hypertension: Secondary | ICD-10-CM

## 2014-12-06 ENCOUNTER — Telehealth: Payer: Self-pay | Admitting: *Deleted

## 2014-12-06 NOTE — Telephone Encounter (Signed)
Patient called, wanted to let Dr. Ninetta LightsHatcher know that she is making improvement at the SNF.  She is walking and bathing herself, her pain is better than at her last office visit.  She wants to know if he will authorize her to be released from the SNF to home in time for Thanksgiving.  [place] but her adult children/other family members live here in [place].  She will still be on IV antibiotics through 12/24 via PICC, but really does NOT want to stay at a facility the entire time.  RN advised I would pass along the information and her question, but that Dr. Ninetta LightsHatcher is not the only one to determine her discharge.  She sees Dr. Lovell SheehanJenkins 11/22.  Patient verbalized understanding, but still wants to know what Dr. Ninetta LightsHatcher thinks. Andree CossHowell, Liyat Faulkenberry M, RN

## 2014-12-12 ENCOUNTER — Non-Acute Institutional Stay (SKILLED_NURSING_FACILITY): Payer: Medicare Other | Admitting: Adult Health

## 2014-12-12 DIAGNOSIS — F329 Major depressive disorder, single episode, unspecified: Secondary | ICD-10-CM

## 2014-12-12 DIAGNOSIS — K5903 Drug induced constipation: Secondary | ICD-10-CM | POA: Diagnosis not present

## 2014-12-12 DIAGNOSIS — G629 Polyneuropathy, unspecified: Secondary | ICD-10-CM | POA: Diagnosis not present

## 2014-12-12 DIAGNOSIS — M4646 Discitis, unspecified, lumbar region: Secondary | ICD-10-CM | POA: Diagnosis not present

## 2014-12-12 DIAGNOSIS — M5410 Radiculopathy, site unspecified: Secondary | ICD-10-CM

## 2014-12-12 DIAGNOSIS — G8929 Other chronic pain: Secondary | ICD-10-CM

## 2014-12-12 DIAGNOSIS — T402X5A Adverse effect of other opioids, initial encounter: Secondary | ICD-10-CM | POA: Diagnosis not present

## 2014-12-12 DIAGNOSIS — K219 Gastro-esophageal reflux disease without esophagitis: Secondary | ICD-10-CM

## 2014-12-12 DIAGNOSIS — I10 Essential (primary) hypertension: Secondary | ICD-10-CM | POA: Diagnosis not present

## 2014-12-12 DIAGNOSIS — F32A Depression, unspecified: Secondary | ICD-10-CM

## 2014-12-21 ENCOUNTER — Encounter: Payer: Self-pay | Admitting: Infectious Diseases

## 2014-12-21 ENCOUNTER — Ambulatory Visit (INDEPENDENT_AMBULATORY_CARE_PROVIDER_SITE_OTHER): Payer: Medicare Other | Admitting: Infectious Diseases

## 2014-12-21 VITALS — BP 136/84 | HR 84 | Temp 97.8°F | Ht 66.0 in | Wt 222.1 lb

## 2014-12-21 DIAGNOSIS — M4620 Osteomyelitis of vertebra, site unspecified: Secondary | ICD-10-CM | POA: Diagnosis present

## 2014-12-21 DIAGNOSIS — Z8619 Personal history of other infectious and parasitic diseases: Secondary | ICD-10-CM

## 2014-12-21 MED ORDER — DOXYCYCLINE HYCLATE 100 MG PO TABS: 100.0000 mg | ORAL_TABLET | Freq: Two times a day (BID) | ORAL | Status: AC

## 2014-12-21 MED ORDER — RIFAMPIN 300 MG PO CAPS: 300.0000 mg | ORAL_CAPSULE | Freq: Two times a day (BID) | ORAL | Status: AC

## 2014-12-21 NOTE — Assessment & Plan Note (Signed)
States she has been treated.  She did not have virus detectable on her genotype.

## 2014-12-21 NOTE — Assessment & Plan Note (Addendum)
She appears to be improving.  Her inflammatory markers are  variable (normalized or only slightly elevated), Will pull her pic Will change her to PO anbx She will [facility] and stay on anbx til then. She will contact them for a f/u MRI.

## 2014-12-21 NOTE — Progress Notes (Signed)
   Subjective:    Patient ID: Mackenzie Charles, female    DOB: [birth year], 54 y.o.   MRN: [id number]  HPI 54 y.o. F with depression, chronic pain, previous Hep C treated, and numerous back surgeries (done at [facility]), last in Jan 2015 for fusion. She was seen in mid September with worsening back pain and found to have lumbar discitis and epidural abscess. She underwent I x D by Dr. Arnoldo Morale. OR cx grew microaerophilic strep on 2/35. She was discharged on 6 wks of ceftriaxone 2gm IV daily plus oral rifampin.  She had worsening back pain radiating down her left leg, thus wen to [facility] for evaluation. A follow-up MRI demonstrated 2 new areas of osteomyelitis discitis and ventral epidural abscess, plus psoas abscess. She was adm on 10-14 and started on vancomycin and piptazo then changed to vanco/ceftriaxone/rifampin. She had fluid aspirate done on 10-14 that was Cx (-).  SNF had concern that she was using picc line reacreationally while she was not at the facility (was leaving the facility).   She had repeat MRI in ED 11-2 that showed: Mild further destructive bony changes L1-2 compatible with history of osteomyelitis though, there is interval decrease in size of multi focal fluid collections, including ventral epidural fluid collection consistent with response to treatment. Partially re-expanded spinal canal at L1. Similar enhancement about the L5-S1 interbody fusion material which can be seen with instability or, infection. Grade 1 L5-S1 anterolisthesis. Recommend close attention on follow-up.  She was seen in ID f/u on 11-2 and her IV anbx were extended to 12-2.  Her pain has been improving- she is now doing her own ADLs, walking with walker. Sensation in LE is improving.  Her RUE PIC has not given her any problems.  Has been back to [facility], she will have next appt 02-2015.   Labs this AM showed CRP 1.3, Vanco tr 15.2, ESR Pending,  11-22 CRP < 0.5, Cr 0.52, ESR 14,  Review of  Systems  Constitutional: Positive for unexpected weight change. Negative for fever and chills.  Gastrointestinal: Negative for diarrhea and constipation.  Genitourinary: Negative for difficulty urinating.  Neurological: Positive for numbness.  lost ~35#.      Objective:   Physical Exam  Constitutional: She appears well-developed and well-nourished.  HENT:  Mouth/Throat: No oropharyngeal exudate.  Eyes: Pupils are equal, round, and reactive to light.  Neck: Neck supple.  Cardiovascular: Normal rate, regular rhythm and normal heart sounds.   Pulmonary/Chest: Effort normal and breath sounds normal.  Abdominal: Soft. Bowel sounds are normal. There is no tenderness. There is no rebound.  Musculoskeletal:       Arms: Lymphadenopathy:    She has no cervical adenopathy.       Assessment & Plan:

## 2014-12-23 ENCOUNTER — Telehealth: Payer: Self-pay | Admitting: *Deleted

## 2014-12-23 ENCOUNTER — Non-Acute Institutional Stay (SKILLED_NURSING_FACILITY): Payer: Medicare Other | Admitting: Adult Health

## 2014-12-23 DIAGNOSIS — M4620 Osteomyelitis of vertebra, site unspecified: Secondary | ICD-10-CM | POA: Diagnosis not present

## 2014-12-23 DIAGNOSIS — M4646 Discitis, unspecified, lumbar region: Secondary | ICD-10-CM

## 2014-12-23 DIAGNOSIS — I10 Essential (primary) hypertension: Secondary | ICD-10-CM | POA: Diagnosis not present

## 2014-12-23 DIAGNOSIS — G629 Polyneuropathy, unspecified: Secondary | ICD-10-CM | POA: Diagnosis not present

## 2014-12-23 NOTE — Telephone Encounter (Signed)
Patient called and requested a letter be sent to her dentist stating that she needs her teeth removed for her better overall health. She also wants him to include something about what she has been through the past few months. Advised will let the doctor know what she wants and give her a call back if he will write the letter.

## 2014-12-23 NOTE — Telephone Encounter (Signed)
Happy to do this (or if you want to, I will sign it) when I am in clinic on Monday  Thanks jeff

## 2014-12-26 ENCOUNTER — Encounter: Payer: Self-pay | Admitting: Adult Health

## 2014-12-26 DIAGNOSIS — I10 Essential (primary) hypertension: Secondary | ICD-10-CM | POA: Insufficient documentation

## 2014-12-26 NOTE — Progress Notes (Signed)
Patient ID: Mackenzie Charles, female   DOB: Feb 14, 1960, 54 y.o.   MRN: [id number]   [facility]      No Known Allergies  Chief Complaint  Patient presents with  . Discharge Note    HPI: She is being discharged to home with home health for pt/rn. She will need a standard wheelchair. She will need her prescriptions to be written. She will need to follow up with her pcp. We discussed at great length her pain medications; I will give her one week supply in order for her to return back to her pain clinic for further pain management.   Past Medical History  Diagnosis Date  . Depression   . Chronic back pain   . GERD (gastroesophageal reflux disease)   . Chronic pain     Past Surgical History  Procedure Laterality Date  . Cervical fusion    . Lumbar laminectomy/decompression microdiscectomy  10/07/2014    Procedure: LUMBAR LAMINECTOMY  Lumbar one - two;  Surgeon: Tressie StalkerJeffrey Jenkins, MD;  Location: [facility];  Service: Neurosurgery;;    VITAL SIGNS BP 138/76 mmHg  Pulse 78  Ht 5\' 7"  (1.702 m)  Wt 226 lb (102.513 kg)  BMI 35.39 kg/m2  Patient's Medications  New Prescriptions   No medications on file  Previous Medications   AMINO ACIDS-PROTEIN HYDROLYS (FEEDING SUPPLEMENT, PRO-STAT SUGAR FREE 64,) LIQD    Take 30 mLs by mouth daily.   BUPROPION (WELLBUTRIN) 100 MG TABLET    Take 100 mg by mouth 2 (two) times daily.   CEFTRIAXONE 2 G IN DEXTROSE 5 % 50 ML    Inject 2 g into the vein daily. Stop date 12/15/14   CLOTRIMAZOLE (LOTRIMIN) 1 % CREAM    Apply 1 application topically 2 (two) times daily.   CYCLOBENZAPRINE (FLEXERIL) 10 MG TABLET    Take 10 mg by mouth 3 (three) times daily as needed for muscle spasms.   DICLOFENAC SODIUM (VOLTAREN) 1 % GEL    Apply 2 g topically 4 (four) times daily.   DOXYCYCLINE (VIBRA-TABS) 100 MG TABLET    Take 1 tablet (100 mg total) by mouth 2 (two) times daily.   GABAPENTIN (NEURONTIN) 300 MG CAPSULE    Take 2 capsules (600 mg  total) by mouth 3 (three) times daily.   HYDROMORPHONE (DILAUDID) 2 MG TABLET    Take 1 tablet (2 mg total) by mouth every 4 (four) hours as needed for severe pain.   LISINOPRIL (PRINIVIL,ZESTRIL) 20 MG TABLET    Take 20 mg by mouth daily.   METHADONE (DOLOPHINE) 10 MG TABLET    Take 1-2 tablets (10-20 mg total) by mouth 3 (three) times daily. 20 mg at 8am, 10 pm at 2pm and 20 mg at 8pm   MULTIPLE VITAMINS-MINERALS (MULTIVITAMIN ADULT PO)    Take 1 tablet by mouth daily.   OMEPRAZOLE (PRILOSEC) 40 MG CAPSULE    Take 40 mg by mouth daily.   POLYETHYLENE GLYCOL (MIRALAX) PACKET    Take 17 g by mouth 2 (two) times daily.   PROMETHAZINE (PHENERGAN) 25 MG TABLET    Take 25 mg by mouth every 6 (six) hours as needed for nausea or vomiting.   RIFAMPIN (RIFADIN) 300 MG CAPSULE    Take 1 capsule (300 mg total) by mouth 2 (two) times daily. Stop date 12/15/14   SENNA (SENOKOT) 8.6 MG TABS TABLET    Take 2 tablets (17.2 mg total) by mouth at bedtime.   SERTRALINE (ZOLOFT)  100 MG TABLET    Take 100 mg by mouth daily.   VANCOMYCIN (VANCOCIN) 750 MG/150ML SOLN    Inject 150 mLs (750 mg total) into the vein every 8 (eight) hours. Stop date 12/15/14  Modified Medications   No medications on file  Discontinued Medications   No medications on file     SIGNIFICANT DIAGNOSTIC EXAMS   Review of Systems  Constitutional: Negative for malaise/fatigue.  HENT: Negative.   Respiratory: Negative for cough and shortness of breath.   Cardiovascular: Negative for chest pain, palpitations and leg swelling.  Gastrointestinal: Negative for heartburn, abdominal pain and constipation.  Musculoskeletal: Negative for myalgias and joint pain.       Her chronic pain is being managed   Neurological: Negative for headaches.  Psychiatric/Behavioral: The patient is not nervous/anxious.     Physical Exam  Constitutional: She is oriented to person, place, and time. No distress.  Overweight   Eyes: Conjunctivae are normal.    Neck: Neck supple. No JVD present. No thyromegaly present.  Cardiovascular: Normal rate, regular rhythm and intact distal pulses.   Respiratory: Effort normal and breath sounds normal. No respiratory distress. She has no wheezes.  GI: Soft. Bowel sounds are normal. She exhibits no distension. There is no tenderness.  Musculoskeletal: She exhibits no edema.  Able to move all extremities   Lymphadenopathy:    She has no cervical adenopathy.  Neurological: She is alert and oriented to person, place, and time.  Skin: Skin is warm and dry. She is not diaphoretic.  Psychiatric: She has a normal mood and affect.     ASSESSMENT/ PLAN:  Will discharge to home with home health for pt/rn to evaluate and treat as indicated for gait balance and medication management. She will require a standard wheelchair in order for her to maintain her current level of independence with her adl's which cannot be achieved with a walker; she can self propel. Her prescriptions have been written for a 30 day supply of her medications with a 7 day supply of methadone and #40 dilaudid 2 mg tabs. She hsa a follow up with Tawni Pummel [facility] BS on 01-06-15 at 10:40 AM   Time spent with patient  45  minutes >50% time spent counseling; reviewing medical record; tests; labs; and developing future plan of care   Synthia Innocent NP [facility]  Contact [phone] Monday through Friday 8am- 5pm  After hours call [phone]

## 2015-01-05 ENCOUNTER — Telehealth: Payer: Self-pay | Admitting: *Deleted

## 2015-01-05 NOTE — Telephone Encounter (Signed)
Pain increasing - back, hips and legs.  Per Dr. Moshe CiproHatcher's last office note the pt [facility].  [facility] or if pain is significant [facility].  Patient verbalized understanding and stated that she would have one of her children transport her for care.

## 2015-01-16 ENCOUNTER — Encounter: Payer: Self-pay | Admitting: Adult Health

## 2015-01-16 NOTE — Progress Notes (Signed)
Patient ID: Mackenzie MageVictoria Renee Charles, female   DOB: 05/28/1960, 54 y.o.   MRN: [id number]   [facility]      No Known Allergies  Chief Complaint  Patient presents with  . Acute Visit    hypertension     HPI:  Her blood pressure readings are elevated up to 189/104. She denies any shortness of breath; chest pain or blurred vision. She has not been on treatment prior to this.     Past Medical History  Diagnosis Date  . Depression   . Chronic back pain   . GERD (gastroesophageal reflux disease)   . Chronic pain     Past Surgical History  Procedure Laterality Date  . Cervical fusion    . Lumbar laminectomy/decompression microdiscectomy  10/07/2014    Procedure: LUMBAR LAMINECTOMY  Lumbar one - two;  Surgeon: Tressie StalkerJeffrey Jenkins, MD;  Location: [facility];  Service: Neurosurgery;;    VITAL SIGNS BP 189/104 mmHg  Pulse 90  Ht 5\' 7"  (1.702 m)  Wt 239 lb (108.41 kg)  BMI 37.42 kg/m2  Patient's Medications  New Prescriptions   No medications on file  Previous Medications   AMINO ACIDS-PROTEIN HYDROLYS (FEEDING SUPPLEMENT, PRO-STAT SUGAR FREE 64,) LIQD    Take 30 mLs by mouth daily.   BUPROPION (WELLBUTRIN) 100 MG TABLET    Take 100 mg by mouth 2 (two) times daily.   CEFTRIAXONE 2 G IN DEXTROSE 5 % 50 ML    Inject 2 g into the vein daily. Stop date 12/15/14   CLOTRIMAZOLE (LOTRIMIN) 1 % CREAM    Apply 1 application topically 2 (two) times daily.   CYCLOBENZAPRINE (FLEXERIL) 10 MG TABLET    Take 10 mg by mouth 3 (three) times daily as needed for muscle spasms.   DICLOFENAC SODIUM (VOLTAREN) 1 % GEL    Apply 2 g topically 4 (four) times daily.   DOXYCYCLINE (VIBRA-TABS) 100 MG TABLET    Take 1 tablet (100 mg total) by mouth 2 (two) times daily.   GABAPENTIN (NEURONTIN) 300 MG CAPSULE    Take 2 capsules (600 mg total) by mouth 3 (three) times daily.   HYDROMORPHONE (DILAUDID) 2 MG TABLET    Take 1 tablet (2 mg total) by mouth every 4 (four) hours as needed for severe  pain.   METHADONE (DOLOPHINE) 10 MG TABLET    Take 1-2 tablets (10-20 mg total) by mouth 3 (three) times daily. 20 mg at 8am, 10 pm at 2pm and 20 mg at 8pm   MULTIPLE VITAMINS-MINERALS (MULTIVITAMIN ADULT PO)    Take 1 tablet by mouth daily.   OMEPRAZOLE (PRILOSEC) 40 MG CAPSULE    Take 40 mg by mouth daily.   POLYETHYLENE GLYCOL (MIRALAX) PACKET    Take 17 g by mouth 2 (two) times daily.   PROMETHAZINE (PHENERGAN) 25 MG TABLET    Take 25 mg by mouth every 6 (six) hours as needed for nausea or vomiting.   RIFAMPIN (RIFADIN) 300 MG CAPSULE    Take 1 capsule (300 mg total) by mouth 2 (two) times daily. Stop date 12/15/14   SENNA (SENOKOT) 8.6 MG TABS TABLET    Take 2 tablets (17.2 mg total) by mouth at bedtime.   SERTRALINE (ZOLOFT) 100 MG TABLET    Take 100 mg by mouth daily.   VANCOMYCIN (VANCOCIN) 750 MG/150ML SOLN    Inject 150 mLs (750 mg total) into the vein every 8 (eight) hours. Stop date 12/15/14  Modified Medications  No medications on file  Discontinued Medications   No medications on file     SIGNIFICANT DIAGNOSTIC EXAMS   LABS REVIEWED:   11-14-14: wbc 5.0; hgb 10.1; hct 29.4; mcv 71.7; plt 161; glucose 105; bun 4; creat 0.54; k+ 4.1; na++138; liver normal albumin 3.3; crp 4.7; sed rate 53   Review of Systems  Constitutional: Negative for malaise/fatigue.  Eyes: Negative for blurred vision.  Respiratory: Negative for cough and shortness of breath.   Cardiovascular: Negative for chest pain, palpitations and leg swelling.  Gastrointestinal: Negative for heartburn, abdominal pain and constipation.  Musculoskeletal: Negative for myalgias and joint pain.  Skin: Negative.   Neurological: Negative for headaches.  Psychiatric/Behavioral: The patient is not nervous/anxious.     Physical Exam  Constitutional: She is oriented to person, place, and time. No distress.  Overweight   Eyes: Conjunctivae are normal.  Neck: Neck supple. No JVD present. No thyromegaly present.    Cardiovascular: Normal rate, regular rhythm and intact distal pulses.   Respiratory: Effort normal and breath sounds normal. No respiratory distress. She has no wheezes.  GI: Soft. Bowel sounds are normal. She exhibits no distension. There is no tenderness.  Musculoskeletal: She exhibits no edema.  Able to move all extremities   Lymphadenopathy:    She has no cervical adenopathy.  Neurological: She is alert and oriented to person, place, and time.  Skin: Skin is warm and dry. She is not diaphoretic.  Psychiatric: She has a normal mood and affect.      ASSESSMENT/ PLAN:  1. Hypertension: will begin lisinopril 10 mg daily will have staff check blood pressure and pulse every shift     Synthia Innocent NP [facility]  Contact [phone] Monday through Friday 8am- 5pm  After hours call [phone]

## 2015-01-19 ENCOUNTER — Encounter: Payer: Self-pay | Admitting: Adult Health

## 2015-01-19 DIAGNOSIS — T402X5A Adverse effect of other opioids, initial encounter: Secondary | ICD-10-CM

## 2015-01-19 DIAGNOSIS — K5903 Drug induced constipation: Secondary | ICD-10-CM | POA: Insufficient documentation

## 2015-01-19 NOTE — Progress Notes (Signed)
Patient ID: Mackenzie Charles, female   DOB: Dec 09, 1960, 54 y.o.   MRN: [id number]    [facility]      No Known Allergies  Chief Complaint  Patient presents with  . Medical Management of Chronic Issues    HPI:  She is a resident of this facility here for short term rehab being seen for the management of her chronic illnesses. Her blood pressure remains elevated with readings up to 180/100. She is nearly complete with her abt. She will be going home in the near future.    Past Medical History  Diagnosis Date  . Depression   . Chronic back pain   . GERD (gastroesophageal reflux disease)   . Chronic pain     Past Surgical History  Procedure Laterality Date  . Cervical fusion    . Lumbar laminectomy/decompression microdiscectomy  10/07/2014    Procedure: LUMBAR LAMINECTOMY  Lumbar one - two;  Surgeon: Tressie Stalker, MD;  Location: [facility];  Service: Neurosurgery;;    VITAL SIGNS BP 140/90 mmHg  Pulse 84  Ht  (1.702 m)  Wt 226 lb 3.2 oz (102.604 kg)  BMI 35.42 kg/m2  Patient's Medications  New Prescriptions   No medications on file  Previous Medications   AMINO ACIDS-PROTEIN HYDROLYS (FEEDING SUPPLEMENT, PRO-STAT SUGAR FREE 64,) LIQD    Take 30 mLs by mouth daily.   BUPROPION (WELLBUTRIN) 100 MG TABLET    Take 100 mg by mouth 2 (two) times daily.   CEFTRIAXONE 2 G IN DEXTROSE 5 % 50 ML    Inject 2 g into the vein daily. Stop date 12/15/14   CLOTRIMAZOLE (LOTRIMIN) 1 % CREAM    Apply 1 application topically 2 (two) times daily.   CYCLOBENZAPRINE (FLEXERIL) 10 MG TABLET    Take 10 mg by mouth 3 (three) times daily as needed for muscle spasms.   DICLOFENAC SODIUM (VOLTAREN) 1 % GEL    Apply 2 g topically 4 (four) times daily.   DOXYCYCLINE (VIBRA-TABS) 100 MG TABLET    Take 1 tablet (100 mg total) by mouth 2 (two) times daily.   GABAPENTIN (NEURONTIN) 300 MG CAPSULE    Take 2 capsules (600 mg total) by mouth 3 (three) times daily.   HYDROMORPHONE (DILAUDID) 2 MG TABLET    Take 1 tablet (2 mg total) by mouth every 4 (four) hours as needed for severe pain.   LISINOPRIL (PRINIVIL,ZESTRIL) 20 MG TABLET    Take 10 mg by mouth daily.   METHADONE (DOLOPHINE) 10 MG TABLET    Take 1-2 tablets (10-20 mg total) by mouth 3 (three) times daily. 20 mg at 8am, 10 pm at 2pm and 20 mg at 8pm   MULTIPLE VITAMINS-MINERALS (MULTIVITAMIN ADULT PO)    Take 1 tablet by mouth daily.   OMEPRAZOLE (PRILOSEC) 40 MG CAPSULE    Take 40 mg by mouth daily.   POLYETHYLENE GLYCOL (MIRALAX) PACKET    Take 17 g by mouth 2 (two) times daily.   PROMETHAZINE (PHENERGAN) 25 MG TABLET    Take 25 mg by mouth every 6 (six) hours as needed for nausea or vomiting.   RIFAMPIN (RIFADIN) 300 MG CAPSULE    Take 1 capsule (300 mg total) by mouth 2 (two) times daily. Stop date 12/15/14   SENNA (SENOKOT) 8.6 MG TABS TABLET    Take 2 tablets (17.2 mg total) by mouth at bedtime.   SERTRALINE (ZOLOFT) 100 MG TABLET    Take 100 mg by  mouth daily.   VANCOMYCIN (VANCOCIN) 750 MG/150ML SOLN    Inject 150 mLs (750 mg total) into the vein every 8 (eight) hours. Stop date 12/15/14  Modified Medications   No medications on file  Discontinued Medications   No medications on file     SIGNIFICANT DIAGNOSTIC EXAMS   LABS REVIEWED:   11-14-14: wbc 5.0; hgb 10.1; hct 29.4; mcv 71.7; plt 161; glucose 105; bun 4; creat 0.54; k+ 4.1; na++138; liver normal albumin 3.3; crp 4.7; sed rate 53   Review of Systems  Constitutional: Negative for malaise/fatigue.  Eyes: Negative for blurred vision.  Respiratory: Negative for cough and shortness of breath.   Cardiovascular: Negative for chest pain, palpitations and leg swelling.  Gastrointestinal: Negative for heartburn, abdominal pain and constipation.  Musculoskeletal: Negative for myalgias and joint pain.  Skin: Negative.   Neurological: Negative for headaches.  Psychiatric/Behavioral: The patient is not nervous/anxious.     Physical  Exam  Constitutional: She is oriented to person, place, and time. No distress.  Overweight   Eyes: Conjunctivae are normal.  Neck: Neck supple. No JVD present. No thyromegaly present.  Cardiovascular: Normal rate, regular rhythm and intact distal pulses.   Respiratory: Effort normal and breath sounds normal. No respiratory distress. She has no wheezes.  GI: Soft. Bowel sounds are normal. She exhibits no distension. There is no tenderness.  Musculoskeletal: She exhibits no edema.  Able to move all extremities   Lymphadenopathy:    She has no cervical adenopathy.  Neurological: She is alert and oriented to person, place, and time.  Skin: Skin is warm and dry. She is not diaphoretic.  Psychiatric: She has a normal mood and affect.      ASSESSMENT/ PLAN:  1. Hypertension: will increase her lisinopril to 20 mg daily and will continue to monitor her status.   2. Constipation: will continue miralax twice daily and senna s 2 tabs daily   3. Gerd: will continue prilosec 20 mg daily   4. Depression: will continue zoloft 100 mg daily and wellbutrin 100 mg twice daily   5. 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 Chronic pain: [facility]. Will continue methadone 20 mg twice daily and 10 mg in the afternoon; is taking dilaudid 2 mg every 4 hours as needed does us voltaregn gel 2 gm four times daily  7. Lumbar discitis: is followed by I/D; will continue ceftriaxone 2 gm daily; vancomycin 750 mg every 8 hours; rifampin 300 mg twice daily all through 12-15-14.          Synthia Innocenteborah Treanna Dumler NP [facility]  Contact [phone] Monday through Friday 8am- 5pm  After hours call [phone]

## 2015-03-22 DEATH — deceased

## 2016-01-22 IMAGING — MR MR LUMBAR SPINE WO/W CM
4 of 7 series · 18 of 48 positions shown · IV contrast (20    MULTI)
Comparison: MRI lumbar spine November 04, 2014

CLINICAL DATA: Low back pain radiating to at LEFT leg and hip for
several weeks, worsening today. On IV antibiotics for L1-2
osteomyelitis and epidural abscess.

EXAM:
MRI LUMBAR SPINE WITHOUT AND WITH CONTRAST
TECHNIQUE: Multiplanar and multiecho pulse sequences of the lumbar spine were
obtained without and with intravenous contrast.
CONTRAST:  20mL MULTIHANCE GADOBENATE DIMEGLUMINE 529 MG/ML IV SOLN

[Series 4: T2 · sagittal · 4.0mm · 0.55mm/px · 3 of 13 slices shown (1 of 2)]
[im 1/13]
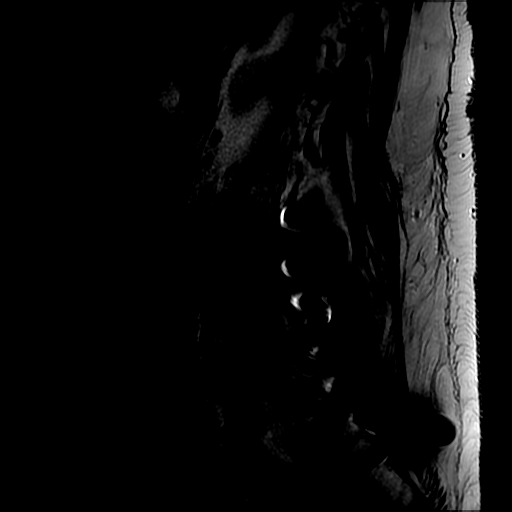
[im 7/13]
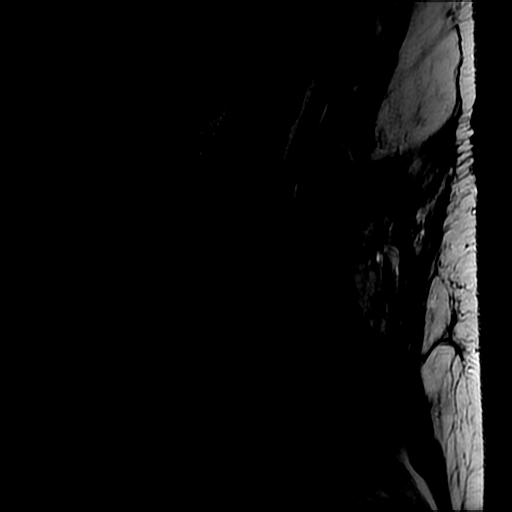
[im 13/13]
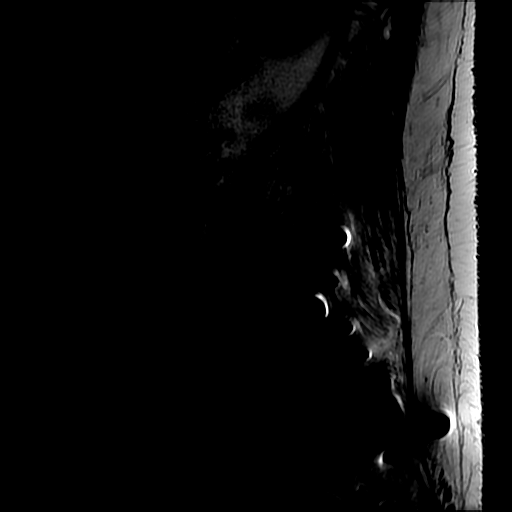

[Series 5: T1 · sagittal · 4.0mm · 0.55mm/px · 3 of 13 slices shown (1 of 2)]
[im 1/13]
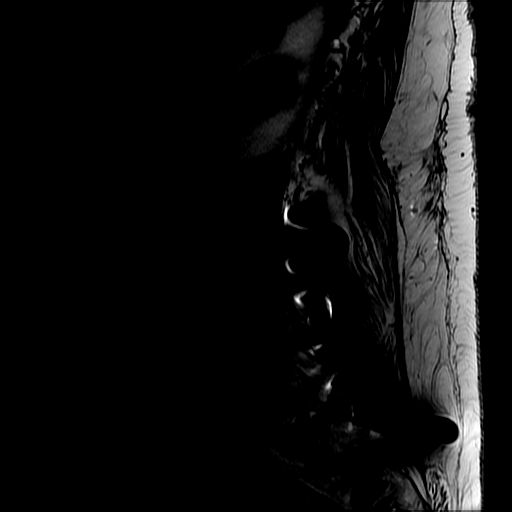
[im 9/13]
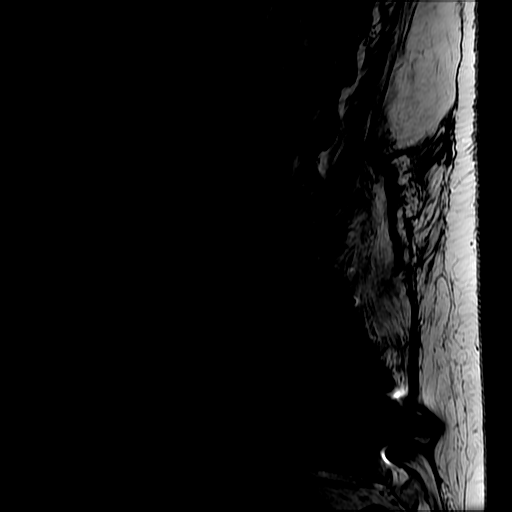
[im 13/13]
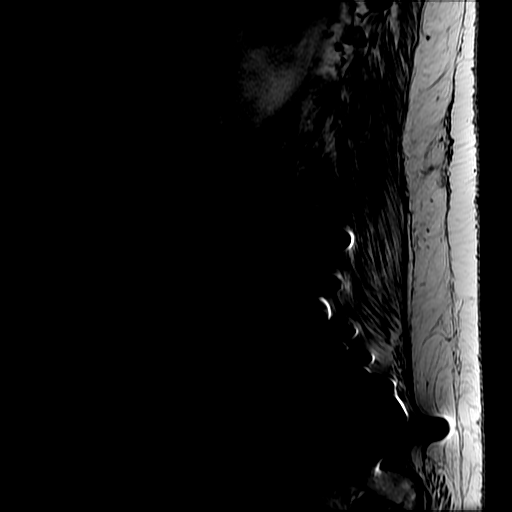

[Series 7: T2 · axial · 4.0mm · 0.39mm/px · z∈[+6,+192]mm · 9 of 36 slices shown (2 of 2)]
[im 1/36]
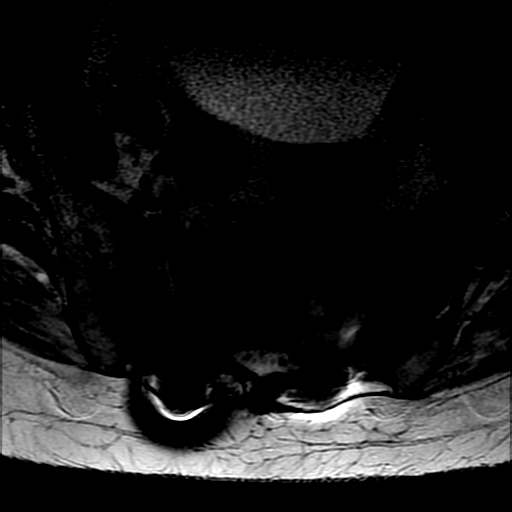
[im 4/36]
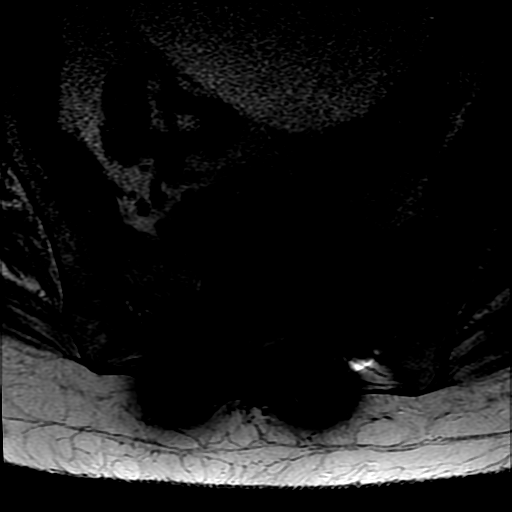
[im 8/36]
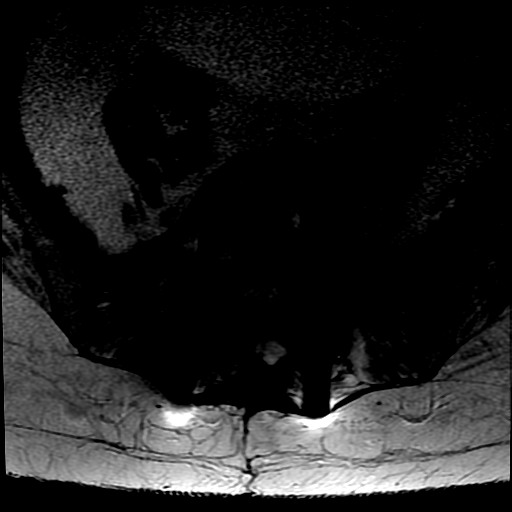
[im 11/36]
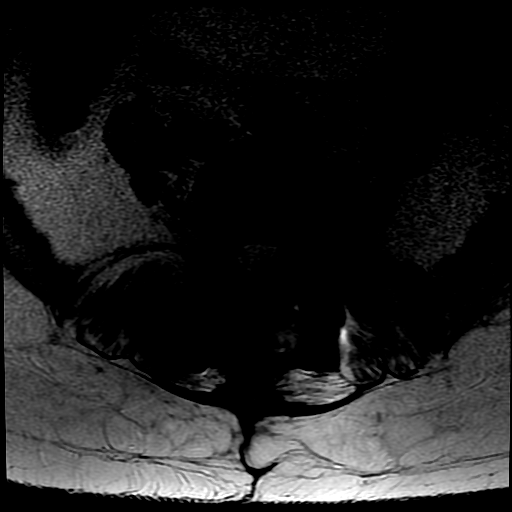
[im 15/36]
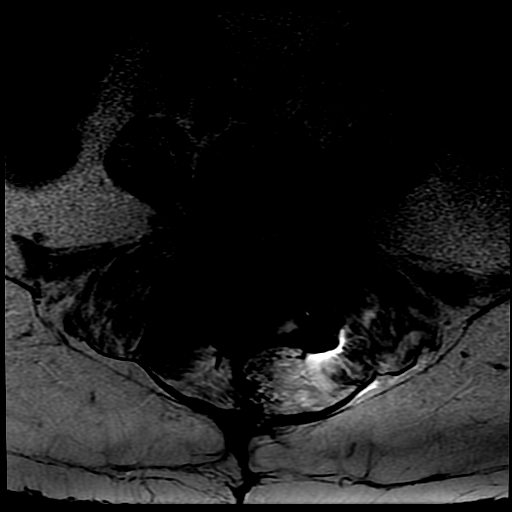
[im 18/36]
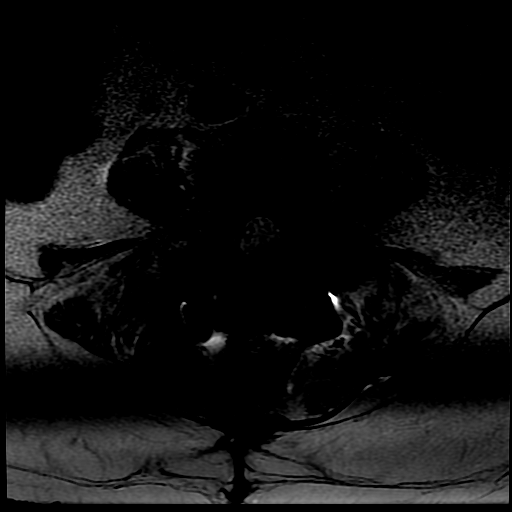
[im 22/36]
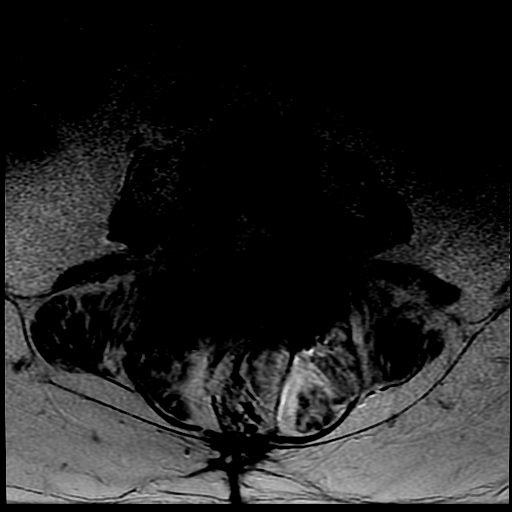
[im 25/36]
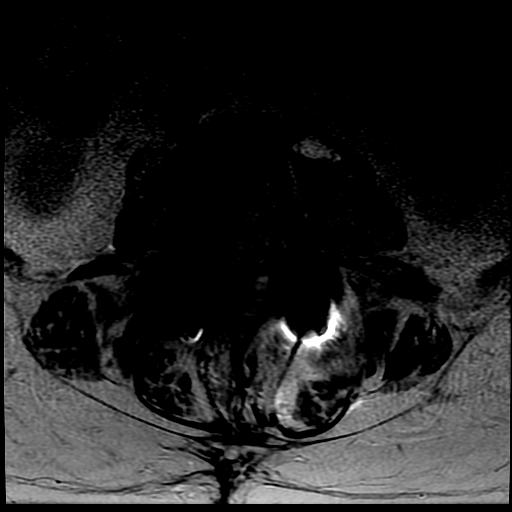
[im 32/36]
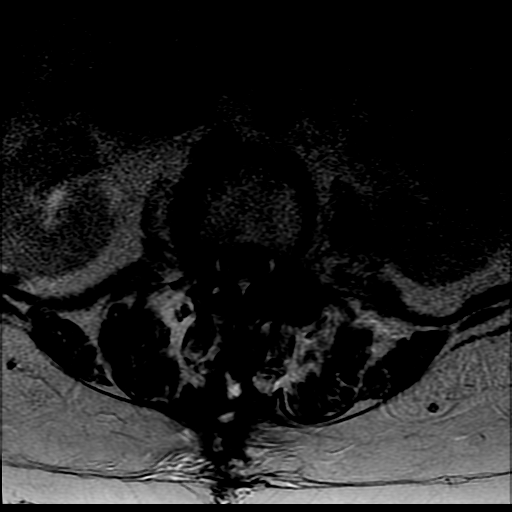

[Series 8: T1 · axial · 4.0mm · 0.39mm/px · z∈[+21,+192]mm · 3 of 36 slices shown (2 of 2)]
[im 4/36]
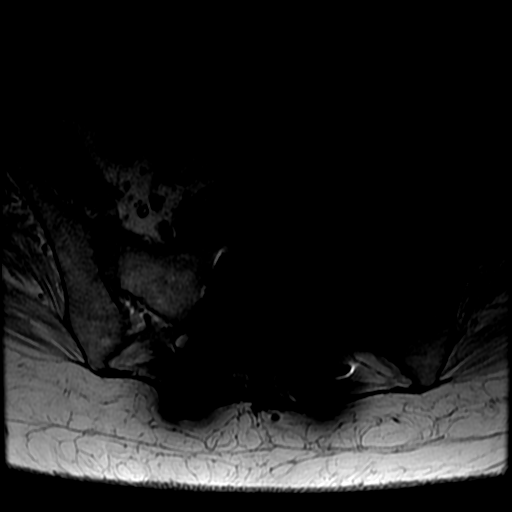
[im 18/36]
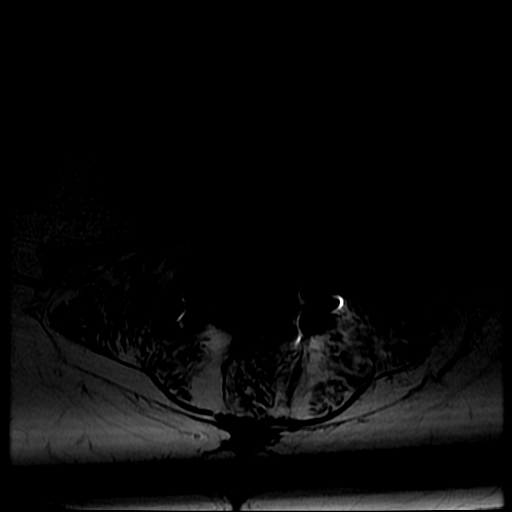
[im 32/36]
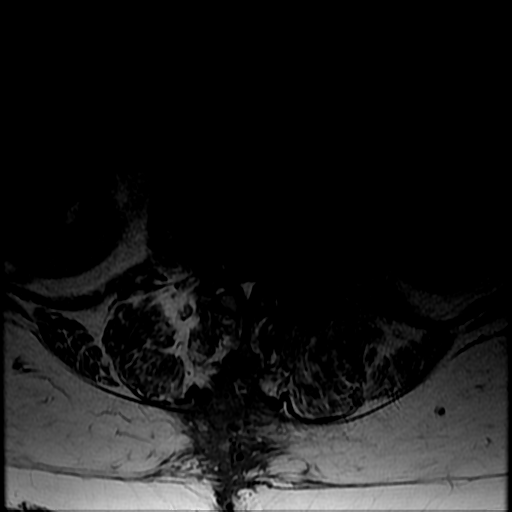

[18 of 48 positions shown; findings below may reference images not displayed]

FINDINGS: Moderately motion degraded examination.

L1-2 intradiscal fluid collection, and endplate edema with abnormal
enhancement again noted, slight increased destructive changes 8 L1
inferior endplate compared to prior study. However, the ventral
epidural fluid collection at L1-2 has decreased in size, 9 x 25 mm,
previously 17 x 44 mm. Interspinous fluid collection at L1-2 is
slightly smaller and, the paraspinal fluid collection within the
subcutaneous fat is smaller. Similar enhancement of the L5-S1 inter
discal material, and mild bright STIR acute discogenic endplate
changes.

Grade 1 L1-2 anterolisthesis, status post L2 through sacral
posterior instrumentation, L1 to through L5-S1 laminectomies. Which
results in extensive susceptibility artifact limiting evaluation,
further exacerbated by patient motion.

Conus medullaris terminates at L1. 20 x 12 mm LEFT prevertebral
abscess was 26 x 16 mm. Bilateral psoas myositis is improved from
prior study. Paraspinal muscle atrophy.

Level by level evaluation:

T12-L1: Moderate broad-based disc bulge, facet arthropathy in
epidural fat result in moderate canal stenosis at T12-L1, unchanged.
No significant neural foraminal narrowing.

L1-2: AP dimension of the spinal canal at L1 is 11 mm, previously 9
mm, due to the ventral epidural fluid collection and facet
arthropathy. Neural foramen cannot be assessed.

L2-3, L3-4, L4-5, L5-S1: Status post PLIF. Widely patent spinal
canal. Neural foramen cannot be assessed.
IMPRESSION: Mild further destructive bony changes L1-2 compatible with history
of osteomyelitis though, there is interval decrease in size of multi
focal fluid collections, including ventral epidural fluid collection
consistent with response to treatment. Partially re-expanded spinal
canal at L1.

Similar enhancement about the L5-S1 interbody fusion material which
can be seen with instability or, infection. Grade 1 L5-S1
anterolisthesis. Recommend close attention on follow-up.
